# Patient Record
Sex: Male | Born: 1975 | Race: White | Hispanic: No | Marital: Married | Smoking: Current every day smoker
Health system: Southern US, Community
[De-identification: ages and names within clinical notes are randomized; demographics above are authoritative.]

## PROBLEM LIST (undated history)

## (undated) DIAGNOSIS — Z98811 Dental restoration status: Secondary | ICD-10-CM

## (undated) DIAGNOSIS — E781 Pure hyperglyceridemia: Secondary | ICD-10-CM

## (undated) DIAGNOSIS — R7303 Prediabetes: Secondary | ICD-10-CM

## (undated) DIAGNOSIS — S80811A Abrasion, right lower leg, initial encounter: Secondary | ICD-10-CM

## (undated) DIAGNOSIS — M67432 Ganglion, left wrist: Secondary | ICD-10-CM

## (undated) HISTORY — PX: KNEE ARTHROSCOPY: SHX127

---

## 2001-11-08 ENCOUNTER — Encounter: Payer: Self-pay | Admitting: *Deleted

## 2001-11-08 ENCOUNTER — Encounter: Admission: RE | Admit: 2001-11-08 | Discharge: 2001-11-08 | Payer: Self-pay | Admitting: *Deleted

## 2007-10-09 ENCOUNTER — Emergency Department (HOSPITAL_COMMUNITY): Admission: EM | Admit: 2007-10-09 | Discharge: 2007-10-09 | Payer: Self-pay | Admitting: Family Medicine

## 2007-12-03 ENCOUNTER — Emergency Department (HOSPITAL_COMMUNITY): Admission: EM | Admit: 2007-12-03 | Discharge: 2007-12-03 | Payer: Self-pay | Admitting: Emergency Medicine

## 2008-11-03 ENCOUNTER — Emergency Department (HOSPITAL_COMMUNITY): Admission: EM | Admit: 2008-11-03 | Discharge: 2008-11-03 | Payer: Self-pay | Admitting: Family Medicine

## 2009-01-05 ENCOUNTER — Emergency Department (HOSPITAL_COMMUNITY): Admission: EM | Admit: 2009-01-05 | Discharge: 2009-01-05 | Payer: Self-pay | Admitting: Family Medicine

## 2009-07-20 ENCOUNTER — Ambulatory Visit (HOSPITAL_COMMUNITY): Admission: RE | Admit: 2009-07-20 | Discharge: 2009-07-20 | Payer: Self-pay | Admitting: Sports Medicine

## 2010-01-03 ENCOUNTER — Emergency Department (HOSPITAL_COMMUNITY): Admission: EM | Admit: 2010-01-03 | Discharge: 2010-01-03 | Payer: Self-pay | Admitting: Family Medicine

## 2010-04-28 ENCOUNTER — Inpatient Hospital Stay (INDEPENDENT_AMBULATORY_CARE_PROVIDER_SITE_OTHER)
Admission: RE | Admit: 2010-04-28 | Discharge: 2010-04-28 | Disposition: A | Discharge status: Home / Self Care | Payer: 59 | Source: Ambulatory Visit | Attending: Emergency Medicine | Admitting: Emergency Medicine

## 2010-04-28 DIAGNOSIS — K5289 Other specified noninfective gastroenteritis and colitis: Secondary | ICD-10-CM

## 2011-04-22 ENCOUNTER — Encounter (HOSPITAL_COMMUNITY): Payer: Self-pay

## 2011-04-22 ENCOUNTER — Emergency Department (INDEPENDENT_AMBULATORY_CARE_PROVIDER_SITE_OTHER)
Admission: EM | Admit: 2011-04-22 | Discharge: 2011-04-22 | Disposition: A | Discharge status: Home / Self Care | Payer: 59 | Source: Home / Self Care | Attending: Family Medicine | Admitting: Family Medicine

## 2011-04-22 DIAGNOSIS — K047 Periapical abscess without sinus: Secondary | ICD-10-CM

## 2011-04-22 DIAGNOSIS — K089 Disorder of teeth and supporting structures, unspecified: Secondary | ICD-10-CM

## 2011-04-22 DIAGNOSIS — K0889 Other specified disorders of teeth and supporting structures: Secondary | ICD-10-CM

## 2011-04-22 MED ORDER — AMOXICILLIN 500 MG PO CAPS: 500.0000 mg | ORAL_CAPSULE | Freq: Three times a day (TID) | ORAL | Status: AC

## 2011-04-22 MED ORDER — HYDROCODONE-ACETAMINOPHEN 5-325 MG PO TABS: ORAL_TABLET | ORAL | Status: AC

## 2011-04-22 NOTE — ED Notes (Signed)
C/o facial swelling and pain since yesterday; minimal relief w OTC meds

## 2011-04-22 NOTE — ED Provider Notes (Signed)
History     CSN: [id number]  Arrival date & time 04/22/11  0932   First MD Initiated Contact with Patient 04/22/11 [phone]      Chief Complaint  Patient presents with  . Facial Swelling    (Consider location/radiation/quality/duration/timing/severity/associated sxs/prior treatment) HPI Comments: Jonathan Choi presents for evaluation of pain and swelling in his upper RIGHT gumline over the last 2 days. He denies any tooth pain or drainage. He has not had any fever.   Patient is a 36 y.o. male presenting with tooth pain. The history is provided by the patient.  Dental PainThe primary symptoms include mouth pain. Primary symptoms do not include dental injury, oral bleeding or oral lesions. The symptoms began yesterday. The symptoms are unchanged. The symptoms occur constantly.  Additional symptoms include: gum swelling, gum tenderness and facial swelling. Additional symptoms do not include: purulent gums, trismus, trouble swallowing and taste disturbance.    History reviewed. No pertinent past medical history.  History reviewed. No pertinent past surgical history.  History reviewed. No pertinent family history.  History  Substance Use Topics  . Smoking status: Current Everyday Smoker  . Smokeless tobacco: Not on file  . Alcohol Use: No      Review of Systems  Constitutional: Negative.   HENT: Positive for facial swelling and dental problem. Negative for trouble swallowing.   Eyes: Negative.   Respiratory: Negative.   Cardiovascular: Negative.   Gastrointestinal: Negative.   Genitourinary: Negative.   Musculoskeletal: Negative.   Skin: Negative.   Neurological: Negative.     Allergies  Review of patient's allergies indicates no known allergies.  Home Medications   Current Outpatient Rx  Name Route Sig Dispense Refill  . AMOXICILLIN 500 MG PO CAPS Oral Take 1 capsule (500 mg total) by mouth 3 (three) times daily. 30 capsule 0  . HYDROCODONE-ACETAMINOPHEN 5-325 MG PO TABS  Take  one to two tablets every 4 to 6 hours as needed for pain 20 tablet 0    BP 135/85  Pulse 70  Temp(Src) 98.1 F (36.7 C) (Oral)  Resp 20  SpO2 98%  Physical Exam  Nursing note and vitals reviewed. Constitutional: He is oriented to person, place, and time. He appears well-developed and well-nourished.  HENT:  Head: Normocephalic and atraumatic.  Mouth/Throat: Uvula is midline, oropharynx is clear and moist and mucous membranes are normal. Dental abscesses and dental caries present.       Tenderness along upper RIGHT gumline with swelling, no fluctuance noted, no drainage  Eyes: EOM are normal.  Neck: Normal range of motion.  Pulmonary/Chest: Effort normal.  Musculoskeletal: Normal range of motion.  Neurological: He is alert and oriented to person, place, and time.  Skin: Skin is warm and dry.  Psychiatric: His behavior is normal.    ED Course  Procedures (including critical care time)  Labs Reviewed - No data to display No results found.   1. Pain, dental   2. Periapical abscess       MDM  Amoxicillin (allergic to clindamycin), hydrocodone PRN, referred to [facility]        Richardo Priest, MD 04/22/11 1030

## 2011-12-15 DIAGNOSIS — Z23 Encounter for immunization: Secondary | ICD-10-CM

## 2012-08-15 ENCOUNTER — Emergency Department (HOSPITAL_COMMUNITY)
Admission: EM | Admit: 2012-08-15 | Discharge: 2012-08-15 | Disposition: A | Discharge status: Home / Self Care | Payer: 59 | Attending: Emergency Medicine | Admitting: Emergency Medicine

## 2012-08-15 ENCOUNTER — Encounter (HOSPITAL_COMMUNITY): Payer: Self-pay | Admitting: Emergency Medicine

## 2012-08-15 DIAGNOSIS — R51 Headache: Secondary | ICD-10-CM | POA: Insufficient documentation

## 2012-08-15 DIAGNOSIS — Y9389 Activity, other specified: Secondary | ICD-10-CM | POA: Insufficient documentation

## 2012-08-15 DIAGNOSIS — W010XXA Fall on same level from slipping, tripping and stumbling without subsequent striking against object, initial encounter: Secondary | ICD-10-CM | POA: Insufficient documentation

## 2012-08-15 DIAGNOSIS — R42 Dizziness and giddiness: Secondary | ICD-10-CM | POA: Insufficient documentation

## 2012-08-15 DIAGNOSIS — S80811A Abrasion, right lower leg, initial encounter: Secondary | ICD-10-CM

## 2012-08-15 DIAGNOSIS — Z23 Encounter for immunization: Secondary | ICD-10-CM | POA: Insufficient documentation

## 2012-08-15 DIAGNOSIS — IMO0002 Reserved for concepts with insufficient information to code with codable children: Secondary | ICD-10-CM | POA: Insufficient documentation

## 2012-08-15 DIAGNOSIS — F17200 Nicotine dependence, unspecified, uncomplicated: Secondary | ICD-10-CM | POA: Insufficient documentation

## 2012-08-15 DIAGNOSIS — Y929 Unspecified place or not applicable: Secondary | ICD-10-CM | POA: Insufficient documentation

## 2012-08-15 DIAGNOSIS — W1809XA Striking against other object with subsequent fall, initial encounter: Secondary | ICD-10-CM | POA: Insufficient documentation

## 2012-08-15 MED ORDER — TETANUS-DIPHTH-ACELL PERTUSSIS 5-2.5-18.5 LF-MCG/0.5 IM SUSP
0.5000 mL | Freq: Once | INTRAMUSCULAR | Status: AC
  Administered 2012-08-15: 0.5 mL via INTRAMUSCULAR
  Filled 2012-08-15: qty 0.5

## 2012-08-15 NOTE — ED Notes (Signed)
Patient with wound on shin of right leg.  Patient states that he hit his shin on the back of his pickup bed, had some grease on the bed.  Area of the wound has pus in it.  Skin around the wound is red, tender.

## 2012-08-15 NOTE — ED Notes (Signed)
PT. REPORTS INFECTED ABRASION AT RIGHT SHIN WITH DRAINAGE , PT . STATED HE TRIPPED AND FELL LAST Friday AND SUSTAINED ABRASION AT RIGHT SHIN , NO LOC /AMBULATORY.

## 2012-08-15 NOTE — ED Notes (Signed)
PT comfortable with d/c and f/u instructions. No prescriptions. 

## 2012-08-15 NOTE — ED Provider Notes (Signed)
History  This chart was scribed for Jonathan Baker, MD by Ardelia Mems, ED Scribe. This patient was seen in room TR06C/TR06C and the patient's care was started at 10:28 PM.   CSN: [id number]  Arrival date & time 08/15/12  2046      Chief Complaint  Patient presents with  . Abrasion     The history is provided by the patient. No language interpreter was used.    HPI Comments: Jonathan Choi is a 37 y.o. male who presents to the Emergency Department complaining of an infected abrasion on his right shin. Pt states that there is associated drainage of the abrasion. Pt states that he has had associated headaches and dizziness for the past 2 days. Pt states that he that he hit his shin on the trailer hitch of a truck approximately 10 days ago, causing the abrasion. He states that touching the affected area worsens the pain. Pt is ambulatory. Pt states that he does not have any chronic medical conditions. Pt is unsure if Tetanus shots are UTD. Pt denies LOC, confusion or any other symptoms. Pt denies alcohol use and is a current every day smoker.   History reviewed. No pertinent past medical history.  History reviewed. No pertinent past surgical history.  No family history on file.  History  Substance Use Topics  . Smoking status: Current Every Day Smoker  . Smokeless tobacco: Not on file  . Alcohol Use: No      Review of Systems  Neurological: Positive for dizziness and headaches. Negative for syncope.    Allergies  Review of patient's allergies indicates no known allergies.  Home Medications  No current outpatient prescriptions on file.  Triage Vitals: BP 141/92  Pulse 98  Temp(Src) 98.7 F (37.1 C) (Oral)  Resp 18  SpO2 98%  Physical Exam  Nursing note and vitals reviewed. Constitutional: He is oriented to person, place, and time. He appears well-developed and well-nourished.  HENT:  Head: Normocephalic and atraumatic.  Eyes: EOM are normal. Pupils are equal,  round, and reactive to light.  Neck: Normal range of motion. No tracheal deviation present.  Pulmonary/Chest: Effort normal. No respiratory distress.  Abdominal: Soft. There is no tenderness.  Musculoskeletal: Normal range of motion.  Neurological: He is alert and oriented to person, place, and time.  Skin: Skin is warm. No rash noted.  Healing abrasion to anterior right shin with 1-2 cm surrounding vasculitic appearing red rash. Non-blanching. No warmth. No calf tenderness. 1+ pitting edema right LE.  Psychiatric: He has a normal mood and affect.    ED Course  Procedures (including critical care time)  DIAGNOSTIC STUDIES: Oxygen Saturation is 98% on RA, normal by my interpretation.    COORDINATION OF CARE: 11:00 PM- Pt advised of plan for treatment and pt agrees.  Medications  TDaP (BOOSTRIX) injection 0.5 mL (not administered)      Labs Reviewed - No data to display No results found.   No diagnosis found. 1. Abrasion, right leg   MDM  No evidence to support cellulitic changes with redness atypical for infection surrounding healing wound. Dr. Arnoldo Morale in to see patient and agrees. Tetanus updated. Encouraged return if symptoms change or worsen.         I personally performed the services described in this documentation, which was scribed in my presence. The recorded information has been reviewed and is accurate.    Arnoldo Hooker, PA-C 08/15/12 2334

## 2012-08-18 NOTE — ED Provider Notes (Signed)
Medical screening examination/treatment/procedure(s) were performed by non-physician practitioner and as supervising physician I was immediately available for consultation/collaboration.   Julie Manly, MD 08/18/12 0018 

## 2012-10-30 ENCOUNTER — Emergency Department (INDEPENDENT_AMBULATORY_CARE_PROVIDER_SITE_OTHER)
Admission: EM | Admit: 2012-10-30 | Discharge: 2012-10-30 | Disposition: A | Discharge status: Home / Self Care | Payer: 59 | Source: Home / Self Care | Attending: Family Medicine | Admitting: Family Medicine

## 2012-10-30 ENCOUNTER — Encounter (HOSPITAL_COMMUNITY): Payer: Self-pay | Admitting: Emergency Medicine

## 2012-10-30 DIAGNOSIS — S36899A Unspecified injury of other intra-abdominal organs, initial encounter: Secondary | ICD-10-CM

## 2012-10-30 DIAGNOSIS — S31831A Laceration without foreign body of anus, initial encounter: Secondary | ICD-10-CM

## 2012-10-30 MED ORDER — HYDROCORTISONE ACETATE 25 MG RE SUPP: 25.0000 mg | Freq: Two times a day (BID) | RECTAL | Status: DC

## 2012-10-30 NOTE — ED Notes (Signed)
C/o rectal bleeding which started yesterday evening.  Patient states he is not constipated.  No medication tried.

## 2012-10-30 NOTE — ED Provider Notes (Signed)
  CSN: [id number]     Arrival date & time 10/30/12  1220 History     None    Chief Complaint  Patient presents with  . Rectal Bleeding   (Consider location/radiation/quality/duration/timing/severity/associated sxs/prior Treatment) Patient is a 37 y.o. male presenting with hematochezia. The history is provided by the patient.  Rectal Bleeding Quality:  Bright red Amount:  Scant Duration:  1 day Timing:  Sporadic Chronicity:  New Context: spontaneously   Context: not anal fissures, not anal penetration, not constipation, not diarrhea, not hemorrhoids and not rectal pain   Similar prior episodes: no   Relieved by:  None tried Worsened by:  Nothing tried Associated symptoms: no abdominal pain and no vomiting     History reviewed. No pertinent past medical history. History reviewed. No pertinent past surgical history. History reviewed. No pertinent family history. History  Substance Use Topics  . Smoking status: Current Every Day Smoker  . Smokeless tobacco: Not on file  . Alcohol Use: No    Review of Systems  Constitutional: Negative.   Gastrointestinal: Positive for hematochezia and anal bleeding. Negative for nausea, vomiting, abdominal pain, diarrhea, constipation, blood in stool and rectal pain.    Allergies  Review of patient's allergies indicates no known allergies.  Home Medications   Current Outpatient Rx  Name  Route  Sig  Dispense  Refill  . hydrocortisone (ANUSOL-HC) 25 MG suppository   Rectal   Place 1 suppository (25 mg total) rectally 2 (two) times daily.   12 suppository   0    BP 138/83  Pulse 72  Temp(Src) 99.1 F (37.3 C) (Oral)  Resp 20  SpO2 97% Physical Exam  Nursing note and vitals reviewed. Constitutional: He is oriented to person, place, and time. He appears well-developed and well-nourished.  Abdominal: Soft. Bowel sounds are normal. He exhibits no distension and no mass. There is no tenderness. There is no rebound and no guarding.   Genitourinary:  Superficial anal tear evident at 1oclock location, blood evident , nontender. No blood on dre, no mass,   Neurological: He is alert and oriented to person, place, and time.  Skin: Skin is warm and dry.    ED Course   Procedures (including critical care time)  Labs Reviewed - No data to display No results found. 1. Anal sphincter tear, initial encounter     MDM    Linna Hoff, MD 10/30/12 1410

## 2013-01-14 ENCOUNTER — Ambulatory Visit: Payer: 59

## 2013-04-22 ENCOUNTER — Emergency Department (HOSPITAL_COMMUNITY)
Admission: EM | Admit: 2013-04-22 | Discharge: 2013-04-22 | Disposition: A | Discharge status: Home / Self Care | Payer: 59 | Source: Home / Self Care | Attending: Emergency Medicine | Admitting: Emergency Medicine

## 2013-04-22 ENCOUNTER — Encounter (HOSPITAL_COMMUNITY): Payer: Self-pay | Admitting: Emergency Medicine

## 2013-04-22 DIAGNOSIS — L299 Pruritus, unspecified: Secondary | ICD-10-CM

## 2013-04-22 LAB — HEPATIC FUNCTION PANEL
ALT: 35 U/L (ref 0–53)
AST: 24 U/L (ref 0–37)
Albumin: 3.9 g/dL (ref 3.5–5.2)
Alkaline Phosphatase: 127 U/L — ABNORMAL HIGH (ref 39–117)
Bilirubin, Direct: <0.2 mg/dL (ref 0.0–0.3)
TOTAL PROTEIN: 7.6 g/dL (ref 6.0–8.3)
Total Bilirubin: <0.2 mg/dL — ABNORMAL LOW (ref 0.3–1.2)

## 2013-04-22 LAB — POCT I-STAT, CHEM 8
BUN: 12 mg/dL (ref 6–23)
CALCIUM ION: 1.19 mmol/L (ref 1.12–1.23)
CHLORIDE: 106 meq/L (ref 96–112)
Creatinine, Ser: 1.1 mg/dL (ref 0.50–1.35)
Glucose, Bld: 97 mg/dL (ref 70–99)
HCT: 46 % (ref 39.0–52.0)
Hemoglobin: 15.6 g/dL (ref 13.0–17.0)
Potassium: 3.9 mEq/L (ref 3.7–5.3)
Sodium: 140 mEq/L (ref 137–147)
TCO2: 25 mmol/L (ref 0–100)

## 2013-04-22 MED ORDER — METHYLPREDNISOLONE ACETATE 80 MG/ML IJ SUSP
INTRAMUSCULAR | Status: AC
  Filled 2013-04-22: qty 1

## 2013-04-22 MED ORDER — RANITIDINE HCL 150 MG PO TABS: 150.0000 mg | ORAL_TABLET | Freq: Two times a day (BID) | ORAL | Status: DC

## 2013-04-22 MED ORDER — PREDNISONE 20 MG PO TABS: ORAL_TABLET | ORAL | Status: DC

## 2013-04-22 MED ORDER — METHYLPREDNISOLONE ACETATE 80 MG/ML IJ SUSP
80.0000 mg | Freq: Once | INTRAMUSCULAR | Status: AC
  Administered 2013-04-22: 80 mg via INTRAMUSCULAR

## 2013-04-22 NOTE — Discharge Instructions (Signed)
Take Zyrtec 10 mg daily.    Pruritus  Pruritis is an itch. There are many different problems that can cause an itch. Dry skin is one of the most common causes of itching. Most cases of itching do not require medical attention.  HOME CARE INSTRUCTIONS  Make sure your skin is moistened on a regular basis. A moisturizer that contains petroleum jelly is best for keeping moisture in your skin. If you develop a rash, you may try the following for relief:   Use corticosteroid cream.  Apply cool compresses to the affected areas.  Bathe with Epsom salts or baking soda in the bathwater.  Soak in colloidal oatmeal baths. These are available at your pharmacy.  Apply baking soda paste to the rash. Stir water into baking soda until it reaches a paste-like consistency.  Use an anti-itch lotion.  Take over-the-counter diphenhydramine medicine by mouth as the instructions direct.  Avoid scratching. Scratching may cause the rash to become infected. If itching is very bad, your caregiver may suggest prescription lotions or creams to lessen your symptoms.  Avoid hot showers, which can make itching worse. A cold shower may help with itching as long as you use a moisturizer after the shower. SEEK MEDICAL CARE IF: The itching does not go away after several days. Document Released: 11/17/2010 Document Revised: 05/30/2011 Document Reviewed: 11/17/2010 [facility] Patient Information 2014 [employer]ExitCare, [place].

## 2013-04-22 NOTE — ED Provider Notes (Signed)
Chief Complaint    Chief Complaint  Patient presents with  . Pruritis    History of Present Illness      Anderson MaltaMark C Allsup is a 38 year old male who has had a two-week history of migratory itching on his posterior neck, chest, abdomen, upper arms, and thighs. He cannot see any rash or breaking out in these areas. The itching seems to migrate from area to area. It's worse after a hot shower. He denies any fever, chills, sore throat, or URI symptoms. He has not been exposed to any allergens or antigens. No new foods or medications. No rash, no exposure to scabies.  Review of Systems   Other than as noted above, the patient denies any of the following symptoms: Systemic:  No fever, chills, or myalgias. ENT:  No nasal congestion, rhinorrhea, sore throat, swelling of lips, tongue or throat. Resp:  No cough, wheezing, or shortness of breath.  PMFSH    Past medical history, family history, social history, meds, and allergies were reviewed.   Physical Exam     Vital signs:  BP 135/85  Pulse 80  Temp(Src) 97.8 F (36.6 C) (Oral)  Resp 16  SpO2 95% Gen:  Alert, oriented, in no distress. ENT:  Pharynx clear, no intraoral lesions, moist mucous membranes. Lungs:  Clear to auscultation. Skin:  Skin is completely clear. There is no rash or erythema, no jaundice or scleral icterus.   Labs   Results for orders placed during the hospital encounter of 04/22/13  HEPATIC FUNCTION PANEL      Result Value Range   Total Protein 7.6  6.0 - 8.3 g/dL   Albumin 3.9  3.5 - 5.2 g/dL   AST 24  0 - 37 U/L   ALT 35  0 - 53 U/L   Alkaline Phosphatase 127 (*) 39 - 117 U/L   Total Bilirubin <0.2 (*) 0.3 - 1.2 mg/dL   Bilirubin, Direct <1.6<0.2  0.0 - 0.3 mg/dL   Indirect Bilirubin NOT CALCULATED  0.3 - 0.9 mg/dL  POCT I-STAT, CHEM 8      Result Value Range   Sodium 140  137 - 147 mEq/L   Potassium 3.9  3.7 - 5.3 mEq/L   Chloride 106  96 - 112 mEq/L   BUN 12  6 - 23 mg/dL   Creatinine, Ser 1.091.10  0.50 -  1.35 mg/dL   Glucose, Bld 97  70 - 99 mg/dL   Calcium, Ion 6.041.19  5.401.12 - 1.23 mmol/L   TCO2 25  0 - 100 mmol/L   Hemoglobin 15.6  13.0 - 17.0 g/dL   HCT 98.146.0  19.139.0 - 47.852.0 %   Course in Urgent Care Center     Given Depo-Medrol 80 mg IM.  Assessment    The encounter diagnosis was Pruritus.  Cause of the pruritus is most likely due to allergy. I don't see any evidence of scabies. His i-STAT 8 was normal. Alkaline phosphatase was minimally elevated, probably not significant, but does need a followup. Will treat as for allergy, and have him followup with Dr. Para SkeansFred Lupton if no better in 2 weeks.  Plan     1.  Meds:  The following meds were prescribed:   New Prescriptions   PREDNISONE (DELTASONE) 20 MG TABLET    3 daily for 5 days, 2 daily for 5 days, 1 daily for 5 days   RANITIDINE (ZANTAC) 150 MG TABLET    Take 1 tablet (150 mg total) by mouth 2 (two)  times daily.   Suggest he take over-the-counter Zyrtec 10 mg daily.  2.  Patient Education/Counseling:  The patient was given appropriate handouts, self care instructions, and instructed in symptomatic relief.    3.  Follow up:  The patient was told to follow up here if no better in 3 to 4 days, or sooner if becoming worse in any way, and given some red flag symptoms such as worsening rash, fever, or difficulty breathing which would prompt immediate return.  Follow up with Dr. Para Skeans if no better in 2 weeks.      Reuben Likes, MD 04/22/13 [phone]

## 2013-04-24 NOTE — ED Notes (Signed)
Hepatic Function panel: Alkaline phos. 127 H, Total bili <0.2 L, rest within normal limits.  2/3 Message sent to Dr. Lorenz CoasterKeller.  2/4 He said he notified the pt. and told him to f/u with his PCP. Vassie MoselleYork, Raeven Pint M 04/24/2013

## 2013-07-23 ENCOUNTER — Emergency Department (INDEPENDENT_AMBULATORY_CARE_PROVIDER_SITE_OTHER): Payer: 59

## 2013-07-23 ENCOUNTER — Emergency Department (HOSPITAL_COMMUNITY)
Admission: EM | Admit: 2013-07-23 | Discharge: 2013-07-23 | Disposition: A | Discharge status: Home / Self Care | Payer: 59 | Source: Home / Self Care | Attending: Emergency Medicine | Admitting: Emergency Medicine

## 2013-07-23 ENCOUNTER — Encounter (HOSPITAL_COMMUNITY): Payer: Self-pay | Admitting: Emergency Medicine

## 2013-07-23 DIAGNOSIS — M84375A Stress fracture, left foot, initial encounter for fracture: Secondary | ICD-10-CM

## 2013-07-23 DIAGNOSIS — M8430XA Stress fracture, unspecified site, initial encounter for fracture: Secondary | ICD-10-CM

## 2013-07-23 MED ORDER — HYDROCODONE-ACETAMINOPHEN 5-325 MG PO TABS: ORAL_TABLET | ORAL | Status: DC

## 2013-07-23 NOTE — ED Provider Notes (Signed)
  Chief Complaint   Chief Complaint  Patient presents with  . Foot Pain    History of Present Illness   Jonathan Choi is a 38 year old male who has had a three-day history of pain in the left foot overlying the third metatarsal. There is slight swelling. There is no erythema or heat. He denies any injury to the foot. It hurts with weightbearing it is better if he gets off his feet. He denies any numbness or tingling.  Review of Systems   Other than as noted above, the patient denies any of the following symptoms: Systemic:  No fevers or chills. Musculoskeletal:  No joint pain or arthritis.  Neurological:  No muscular weakness, paresthesias.   PMFSH   Past medical history, family history, social history, meds, and allergies were reviewed.     Physical  Examination     Vital signs:  BP 127/86  Pulse 75  Temp(Src) 98.1 F (36.7 C) (Oral)  Resp 16  SpO2 97% Gen:  Alert and oriented times 3.  In no distress. Musculoskeletal:  Exam of the foot reveals localized tenderness to palpation over the third metatarsal. No swelling or erythema.  Otherwise, all joints had a full a ROM with no swelling, bruising or deformity.  No edema, pulses full. Extremities were warm and pink.  Capillary refill was brisk.  Skin:  Clear, warm and dry.  No rash. Neuro:  Alert and oriented times 3.  Muscle strength was normal.  Sensation was intact to light touch.    Radiology   Dg Foot Complete Left  07/23/2013   CLINICAL DATA:  Left foot pain for 3 days.  No known injury  EXAM: LEFT FOOT - COMPLETE 3+ VIEW  COMPARISON:  None.  FINDINGS: Three views of left foot submitted. No acute fracture or subluxation. No radiopaque foreign body. No periosteal reaction or bony erosion.  IMPRESSION: Negative.   Electronically Signed   By: Natasha MeadLiviu  Pop M.D.   On: 07/23/2013 17:11   I reviewed the images independently and personally and concur with the radiologist's findings.  Course in Urgent Care Center   Given a Cam  Walker boot and crutches.  Assessment   The encounter diagnosis was Stress fracture of left foot.  No evidence of gout or acute fracture.  Plan    1.  Meds:  The following meds were prescribed:   Discharge Medication List as of 07/23/2013  5:25 PM    START taking these medications   Details  HYDROcodone-acetaminophen (NORCO/VICODIN) 5-325 MG per tablet 1 to 2 tabs every 4 to 6 hours as needed for pain., Print        2.  Patient Education/Counseling:  The patient was given appropriate handouts, self care instructions, and instructed in symptomatic relief including rest and activity, elevation, application of ice and compression.    3.  Follow up:  The patient was told to follow up here if no better in 3 to 4 days, or sooner if becoming worse in any way, and given some red flag symptoms such as worsening pain or neurological symptoms which would prompt immediate return.  Follow up with Dr. Renaye Rakersim Murphy within the next week.      Reuben Likesavid C  Rodriquez, MD 07/23/13 [phone]

## 2013-07-23 NOTE — Discharge Instructions (Signed)
Stress Fracture When too much stress is put on the foot, as may occur in running and jumping sports, the lengthy shafts of the bones of the forefoot become susceptible to breaking due to repetitive stress (stress fracture) because of thinness of these bone. A stress fracture is more common if osteoporosis is present or if inadequate athletic footwear is used. Shoes should be used which adequately support the sole of the foot to absorb the shocks of the activity participated in. Stress fractures are very common in competitive male runners who develop these small cracks on the surface of the bones in their legs and feet. The women most likely to suffer these injuries are those who restrict food intake and those who have irregular periods. Stress fractures usually start out as a minor discomfort in the foot or leg. The completion of fracture due to repetitive loading often occurs near the end of a long run. The pain may dissipate with rest. With the next exercise session, the pain may return earlier in the run. If an athlete notices that it hurts to touch just one spot on a bone and then stops running for a week, he or she may be tempted to return to running too soon. Often the pain is ignored in order to continue with high impact exercise. A stress fracture then develops. The athlete now has to avoid the hard pounding of running, but can ride a bike or swim for exercise once the pain has resolved with normal weight bearing until the fracture heals in 6 12 weeks. The most common sites for stress fractures are the bones in the front of the feet (metatarsals) and the long bone of the lower leg (tibia), but running can cause stress fractures anywhere in the lower extremities or pelvis. DIAGNOSIS  Usually the diagnosis is made by reviewing the patient's history. The bone involved progressively becomes more painful with activities. X-rays may show no break within the first 2 3 weeks that pain begins. A later X-ray may  show signs that the bone is healing. Having a bone scan or MRI usually makes an earlier diagnosis possible. HOME CARE INSTRUCTIONS  Treatment may include a cast or walking shoe.  High impact activities should be stopped until advised by your caregiver.  Wear shoes with adequate shock absorbing abilities and good support of the sole of the foot. This is especially important in the arch of the foot.  Alternative exercise may be undertaken while waiting for healing. This may include bicycling and swimming. If you do not have a cast or splint:  You may walk on your injured foot as tolerated or advised.  Do not put any weight on your injured foot until instructed. Slowly increase the amount of time you walk on the foot as the pain allows or as advised.  Use crutches until you can bear weight without pain. A gradual increase in weight bearing may help.  Apply ice to the injured area for the first 2 days after you have been treated or as directed by your caregiver.  Put ice in a plastic bag.  Place a towel between your skin and the bag.  Leave the ice on for 15 20 minutes at a time, every hour while you are awake.  Only take over-the-counter or prescription medicines for pain or discomfort as directed by your caregiver.  If your caregiver has given you a follow-up appointment, it is very important to keep that appointment. Not keeping the appointment could result in a   chronic or permanent injury, pain, and disability. SEEK IMMEDIATE MEDICAL CARE IF:   Pain is becoming worse rather than better.  Pain is uncontrolled with medicine.  You have increased swelling or redness in the foot.  The feeling in the foot or leg is diminished. MAKE SURE YOU:   Understand these instructions.  Will watch your condition.  Will get help right away if you are not doing well or get worse. Document Released: 05/28/2002 Document Revised: 07/02/2012 Document Reviewed: 10/22/2007 Exit[employer] Patient  Information 2014 [employer].  

## 2013-07-23 NOTE — ED Notes (Signed)
Pt c/o pain on top of left foot onset 3 days Sx increase when bearing wt Denies inj/trauma Alert w/no signs of acute distress; ambulated well to exam room w/NAD

## 2014-04-14 ENCOUNTER — Encounter: Payer: Self-pay | Admitting: Family Medicine

## 2014-04-14 ENCOUNTER — Ambulatory Visit (INDEPENDENT_AMBULATORY_CARE_PROVIDER_SITE_OTHER): Payer: 59 | Admitting: Family Medicine

## 2014-04-14 VITALS — BP 132/84 | HR 81 | Temp 98.0°F | Ht 71.0 in | Wt 213.4 lb

## 2014-04-14 DIAGNOSIS — E663 Overweight: Secondary | ICD-10-CM

## 2014-04-14 DIAGNOSIS — Z720 Tobacco use: Secondary | ICD-10-CM

## 2014-04-14 DIAGNOSIS — IMO0001 Reserved for inherently not codable concepts without codable children: Secondary | ICD-10-CM | POA: Insufficient documentation

## 2014-04-14 DIAGNOSIS — K6289 Other specified diseases of anus and rectum: Secondary | ICD-10-CM

## 2014-04-14 DIAGNOSIS — E669 Obesity, unspecified: Secondary | ICD-10-CM | POA: Insufficient documentation

## 2014-04-14 DIAGNOSIS — F17200 Nicotine dependence, unspecified, uncomplicated: Secondary | ICD-10-CM

## 2014-04-14 MED ORDER — VARENICLINE TARTRATE 0.5 MG X 11 & 1 MG X 42 PO MISC: ORAL | Status: DC

## 2014-04-14 MED ORDER — VARENICLINE TARTRATE 1 MG PO TABS: 1.0000 mg | ORAL_TABLET | Freq: Two times a day (BID) | ORAL | Status: DC

## 2014-04-14 NOTE — Assessment & Plan Note (Signed)
Pt has skin issues in area between anus and testicles with frequent skin infections/ boils  Also irritation upon wiping after bms  On exam - some mild scarring/ also non thrombosed ext hemorroids (no fissure or fistula noted)  Will ref to derm for recurrent problem with boils

## 2014-04-14 NOTE — Patient Instructions (Addendum)
Try chantix to quit smoking (start with the starter pack and if that goes well than continue 1 mg twice daily)  If side effects / or if it causes depression please stop it and let me know  For weight loss I recommend more exercise and less calories (lower calorie foods and smaller portions) There is a good app to look at called "myfitnesspal" - check it out  I would like to refer you to dermatology for the rectal issue you are having -please stop at check out  Drink more water and keep eating fiber  Avoid sugar drinks as much as you can    Follow up in 6 months for a physical with labs prior
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
## 2014-04-14 NOTE — Assessment & Plan Note (Signed)
Disc strategies for wt loss  Discussed how this problem influences overall health and the risks it imposes  Reviewed plan for weight loss with lower calorie diet (via better food choices and also portion control or program like weight watchers) and exercise building up to or more than 30 minutes 5 days per week including some aerobic activity

## 2014-04-14 NOTE — Assessment & Plan Note (Signed)
Disc in detail risks of smoking and possible outcomes including copd, vascular/ heart disease, cancer , respiratory and sinus infections  Pt voices understanding  Pt is interested in quitting in the future-has failed nicotine replacement  Given px for chantix -disc side eff poss in detail and given handout   ( if he tries this and develops mood change-will stop it and update) Given px for starter pack and then mt dose to fill when he is ready)

## 2014-04-15 ENCOUNTER — Telehealth: Payer: Self-pay | Admitting: Family Medicine

## 2014-04-15 NOTE — Telephone Encounter (Signed)
emmi emailed °

## 2014-04-17 ENCOUNTER — Telehealth: Payer: Self-pay | Admitting: *Deleted

## 2014-04-17 NOTE — Telephone Encounter (Signed)
Prior auth forms placed in Shapale's inbox to await cb from pt.

## 2014-04-17 NOTE — Telephone Encounter (Signed)
Received prior auth request from [facility] for Chantix. I called pt and left message on machine to return call to office. When he calls we need to find out which nicotine replacements he has tried and failed, and if he is currently using any.

## 2014-04-18 NOTE — Telephone Encounter (Signed)
Patient called back and stated he tried a lot of other replacements from patches to gum.

## 2014-04-29 NOTE — Telephone Encounter (Signed)
I followed up on PA, and we still haven't received a response from insurance. I resubmitted PA through Cover my Meds.

## 2014-04-30 NOTE — Telephone Encounter (Signed)
Prior auth for chantix starting month pak has been denied.

## 2014-09-02 ENCOUNTER — Emergency Department
Admission: EM | Admit: 2014-09-02 | Discharge: 2014-09-02 | Disposition: A | Discharge status: Home / Self Care | Payer: 59 | Attending: Emergency Medicine | Admitting: Emergency Medicine

## 2014-09-02 ENCOUNTER — Encounter: Payer: Self-pay | Admitting: Emergency Medicine

## 2014-09-02 ENCOUNTER — Emergency Department: Payer: 59

## 2014-09-02 DIAGNOSIS — M79672 Pain in left foot: Secondary | ICD-10-CM | POA: Insufficient documentation

## 2014-09-02 DIAGNOSIS — Z720 Tobacco use: Secondary | ICD-10-CM | POA: Insufficient documentation

## 2014-09-02 DIAGNOSIS — Z79899 Other long term (current) drug therapy: Secondary | ICD-10-CM | POA: Diagnosis not present

## 2014-09-02 MED ORDER — METHYLPREDNISOLONE 4 MG PO TBPK: ORAL_TABLET | ORAL | Status: DC

## 2014-09-02 MED ORDER — TRAMADOL HCL 50 MG PO TABS: 50.0000 mg | ORAL_TABLET | Freq: Four times a day (QID) | ORAL | Status: DC | PRN

## 2014-09-02 NOTE — ED Provider Notes (Signed)
[facility] Emergency Department Provider Note  ____________________________________________  Time seen: Approximately 6:22 PM  I have reviewed the triage vital signs and the nursing notes.   HISTORY  Chief Complaint Foot Pain    HPI Jonathan Choi is a 39 y.o. male patient complaining of 1 week of left dorsal foot pain. Patient stated there is no provocative incident for his pain. Patient has noticed no deformity, edema or erythema. Patient is rating his pain as a 10 over 10 which is increases when he walks.   Past Medical History  Diagnosis Date  . Alcohol abuse   . Stress fracture 5/15    foot  . Smoker     Patient Active Problem List   Diagnosis Date Noted  . Smoking 04/14/2014  . Overweight 04/14/2014  . Anal inflammation 04/14/2014    History reviewed. No pertinent past surgical history.  Current Outpatient Rx  Name  Route  Sig  Dispense  Refill  . methylPREDNISolone (MEDROL DOSEPAK) 4 MG TBPK tablet      Take Tapered dose as directed   21 tablet   0   . traMADol (ULTRAM) 50 MG tablet   Oral   Take 1 tablet (50 mg total) by mouth every 6 (six) hours as needed for moderate pain.   12 tablet   0   . varenicline (CHANTIX PAK) 0.5 MG X 11 & 1 MG X 42 tablet      Take one 0.5 mg tablet by mouth once daily for 3 days, then increase to one 0.5 mg tablet twice daily for 4 days, then increase to one 1 mg tablet twice daily.   53 tablet   0   . varenicline (CHANTIX) 1 MG tablet   Oral   Take 1 tablet (1 mg total) by mouth 2 (two) times daily.   60 tablet   4     Allergies Review of patient's allergies indicates no known allergies.  Family History  Problem Relation Age of Onset  . Hypertension Mother   . Cancer Father   . Diabetes Father   . Stroke Maternal Grandfather     Social History History  Substance Use Topics  . Smoking status: Current Every Day Smoker  . Smokeless tobacco: Not on file  . Alcohol Use: No     Review of Systems Constitutional: No fever/chills Eyes: No visual changes. ENT: No sore throat. Cardiovascular: Denies chest pain. Respiratory: Denies shortness of breath. Gastrointestinal: No abdominal pain.  No nausea, no vomiting.  No diarrhea.  No constipation. Genitourinary: Negative for dysuria. Musculoskeletal: Left foot pain Skin: Negative for rash. Neurological: Negative for headaches, focal weakness or numbness. 10-point ROS otherwise negative.  ____________________________________________   PHYSICAL EXAM:  VITAL SIGNS: ED Triage Vitals  Enc Vitals Group     BP 09/02/14 1816 146/93 mmHg     Pulse Rate 09/02/14 1816 86     Resp 09/02/14 1816 20     Temp 09/02/14 1816 98.2 F (36.8 C)     Temp Source 09/02/14 1816 Oral     SpO2 09/02/14 1816 96 %     Weight 09/02/14 1816 220 lb (99.791 kg)     Height 09/02/14 1816 6' (1.829 m)     Head Cir --      Peak Flow --      Pain Score 09/02/14 1806 10     Pain Loc --      Pain Edu? --      Excl.  in GC? --     Constitutional: Alert and oriented. Well appearing and in no acute distress. Eyes: Conjunctivae are normal. PERRL. EOMI. Head: Atraumatic. Nose: No congestion/rhinnorhea. Mouth/Throat: Mucous membranes are moist.  Oropharynx non-erythematous. Neck: No stridor.  Hematological/Lymphatic/Immunilogical: No cervical lymphadenopathy. Cardiovascular: Normal rate, regular rhythm. Grossly normal heart sounds.  Good peripheral circulation. Respiratory: Normal respiratory effort.  No retractions. Lungs CTAB. Gastrointestinal: Soft and nontender. No distention. No abdominal bruits. No CVA tenderness. Musculoskeletal: No deformity of the left dorsal foot there is no edema or erythema. She is tender palpation dose aspect of the foot at the second and third metatarsal head. Patient is free nuchal range of motion of the phalanges.  Neurologic:  Normal speech and language. No gross focal neurologic deficits are appreciated.  Speech is normal. No gait instability. Skin:  Skin is warm, dry and intact. No rash noted. Psychiatric: Mood and affect are normal. Speech and behavior are normal.  ____________________________________________   LABS (all labs ordered are listed, but only abnormal results are displayed)  Labs Reviewed - No data to display ____________________________________________  EKG   ____________________________________________  RADIOLOGY  No acute findings on left foot x-ray ____________________________________________   PROCEDURES  Procedure(s) performed: None  Critical Care performed: No  ____________________________________________   INITIAL IMPRESSION / ASSESSMENT AND PLAN / ED COURSE  Pertinent labs & imaging results that were available during my care of the patient were reviewed by me and considered in my medical decision making (see chart for details).  Sprain left foot. ____________________________________________   FINAL CLINICAL IMPRESSION(S) / ED DIAGNOSES  Final diagnoses:  Foot pain, left      Joni Reining, PA-C 09/02/14 1905  Minna Antis, MD 09/02/14 [phone]

## 2014-09-02 NOTE — ED Notes (Signed)
Having pain to foot for about 1-2 weeks without injury.

## 2014-09-02 NOTE — ED Notes (Signed)
Pt reports that he has been having foot pain, for the last week. Denies any known injury.

## 2014-10-07 ENCOUNTER — Telehealth: Payer: Self-pay | Admitting: Family Medicine

## 2014-10-07 DIAGNOSIS — Z0000 Encounter for general adult medical examination without abnormal findings: Secondary | ICD-10-CM

## 2014-10-07 NOTE — Telephone Encounter (Signed)
-----   Message from Alvina Chouerri J Walsh sent at 10/01/2014 12:21 PM EDT ----- Regarding: Lab orders for Wednesday, 7.20.16 Patient is scheduled for CPX labs, please order future labs, Thanks , Camelia Engerri

## 2014-10-08 ENCOUNTER — Other Ambulatory Visit (INDEPENDENT_AMBULATORY_CARE_PROVIDER_SITE_OTHER): Payer: Commercial Managed Care - HMO

## 2014-10-08 DIAGNOSIS — Z0000 Encounter for general adult medical examination without abnormal findings: Secondary | ICD-10-CM

## 2014-10-08 DIAGNOSIS — R7989 Other specified abnormal findings of blood chemistry: Secondary | ICD-10-CM | POA: Diagnosis not present

## 2014-10-08 LAB — CBC WITH DIFFERENTIAL/PLATELET
BASOS PCT: 0.5 % (ref 0.0–3.0)
Basophils Absolute: 0.1 10*3/uL (ref 0.0–0.1)
EOS PCT: 4.2 % (ref 0.0–5.0)
Eosinophils Absolute: 0.5 10*3/uL (ref 0.0–0.7)
HEMATOCRIT: 43.6 % (ref 39.0–52.0)
HEMOGLOBIN: 14.6 g/dL (ref 13.0–17.0)
LYMPHS ABS: 3.5 10*3/uL (ref 0.7–4.0)
LYMPHS PCT: 27.6 % (ref 12.0–46.0)
MCHC: 33.5 g/dL (ref 30.0–36.0)
MCV: 88.9 fl (ref 78.0–100.0)
MONOS PCT: 5.9 % (ref 3.0–12.0)
Monocytes Absolute: 0.7 10*3/uL (ref 0.1–1.0)
NEUTROS PCT: 61.8 % (ref 43.0–77.0)
Neutro Abs: 7.8 10*3/uL — ABNORMAL HIGH (ref 1.4–7.7)
PLATELETS: 285 10*3/uL (ref 150.0–400.0)
RBC: 4.9 Mil/uL (ref 4.22–5.81)
RDW: 13.7 % (ref 11.5–15.5)
WBC: 12.7 10*3/uL — AB (ref 4.0–10.5)

## 2014-10-08 LAB — COMPREHENSIVE METABOLIC PANEL
ALT: 18 U/L (ref 0–53)
AST: 17 U/L (ref 0–37)
Albumin: 4.2 g/dL (ref 3.5–5.2)
Alkaline Phosphatase: 144 U/L — ABNORMAL HIGH (ref 39–117)
BILIRUBIN TOTAL: 0.3 mg/dL (ref 0.2–1.2)
BUN: 13 mg/dL (ref 6–23)
CALCIUM: 9.2 mg/dL (ref 8.4–10.5)
CO2: 27 mEq/L (ref 19–32)
Chloride: 107 mEq/L (ref 96–112)
Creatinine, Ser: 1.01 mg/dL (ref 0.40–1.50)
GFR: 87.89 mL/min (ref >60.00)
Glucose, Bld: 95 mg/dL (ref 70–99)
Potassium: 4.4 mEq/L (ref 3.5–5.1)
SODIUM: 139 meq/L (ref 135–145)
Total Protein: 7 g/dL (ref 6.0–8.3)

## 2014-10-08 LAB — LDL CHOLESTEROL, DIRECT: Direct LDL: 131 mg/dL

## 2014-10-08 LAB — LIPID PANEL
CHOL/HDL RATIO: 7
CHOLESTEROL: 222 mg/dL — AB (ref 0–200)
HDL: 30.6 mg/dL — AB (ref >39.00)

## 2014-10-08 LAB — TSH: TSH: 1.89 u[IU]/mL (ref 0.35–4.50)

## 2014-10-13 ENCOUNTER — Encounter: Payer: Self-pay | Admitting: Family Medicine

## 2014-10-13 ENCOUNTER — Ambulatory Visit (INDEPENDENT_AMBULATORY_CARE_PROVIDER_SITE_OTHER): Payer: Commercial Managed Care - HMO | Admitting: Family Medicine

## 2014-10-13 VITALS — BP 128/78 | HR 84 | Temp 98.2°F | Ht 69.25 in | Wt 206.0 lb

## 2014-10-13 DIAGNOSIS — E663 Overweight: Secondary | ICD-10-CM

## 2014-10-13 DIAGNOSIS — F17200 Nicotine dependence, unspecified, uncomplicated: Secondary | ICD-10-CM

## 2014-10-13 DIAGNOSIS — Z0000 Encounter for general adult medical examination without abnormal findings: Secondary | ICD-10-CM | POA: Diagnosis not present

## 2014-10-13 DIAGNOSIS — Z23 Encounter for immunization: Secondary | ICD-10-CM | POA: Diagnosis not present

## 2014-10-13 DIAGNOSIS — E781 Pure hyperglyceridemia: Secondary | ICD-10-CM

## 2014-10-13 DIAGNOSIS — Z114 Encounter for screening for human immunodeficiency virus [HIV]: Secondary | ICD-10-CM

## 2014-10-13 NOTE — Assessment & Plan Note (Signed)
Discussed how this problem influences overall health and the risks it imposes  Reviewed plan for weight loss with lower calorie diet (via better food choices and also portion control or program like weight watchers) and exercise building up to or more than 30 minutes 5 days per week including some aerobic activity   Pt has lost 14 lb so far with better habits-enc him to keep it up

## 2014-10-13 NOTE — Assessment & Plan Note (Signed)
Disc goals for lipids and reasons to control them Rev labs with pt Rev low sat fat diet in detail Trig over 400  Pt plans to change diet- 2 handouts given  Re check this in 4-6 wk

## 2014-10-13 NOTE — Assessment & Plan Note (Addendum)
Disc in detail risks of smoking and possible outcomes including copd, vascular/ heart disease, cancer , respiratory and sinus infections  Pt voices understanding Pt cannot afford chantix so has cut down to 1/2 ppd and plans to try to quit cold [place] in the future  Not quite ready now -no quit date yet  Pneumonia vaccine today

## 2014-10-13 NOTE — Progress Notes (Signed)
Pre visit review using our clinic review tool, if applicable. No additional management support is needed unless otherwise documented below in the visit note. 

## 2014-10-13 NOTE — Assessment & Plan Note (Signed)
Reviewed health habits including diet and exercise and skin cancer prevention Reviewed appropriate screening tests for age  Also reviewed health mt list, fam hx and immunization status , as well as social and family history   Pt will work on smoking cessation  Rev labs in detail (suspect alk phos is from past etoh and/or current foot problem)-will watch, wbc is from recent prednisone   Rev chol - plan is diet change and re check  Will screen for HIV at that f/u

## 2014-10-13 NOTE — Progress Notes (Signed)
 Subjective:    Patient ID: Jonathan Choi, male    DOB: 01/19/1976, 38 y.o.   MRN: [id number]  HPI Here for health maintenance exam and to review chronic medical problems    Doing ok - feeling ok in general   Wt is down 14 lb with bmi of 30 Cut back on sodas - has made a big difference - gets one per day , and drinks a lot of water    Smoking status - down to a 1/2 ppd - has cut down a lot  No quit date yet  Insurance will not pay for chantix    HIV screen - is interested in that with next labs - but does not feel he is high risk    Flu shot 10/15 PNA -is interested in vaccine   Td 5/14  Prostate hx -no problems and no prostate cancer in the family  No nocturia   Alcohol intake - none at all since jan of 09   Having foot problems - (states xray showed no fx)  Has been to ortho ([facility])  Has had steroids/steroid shot and tramadol    BP Readings from Last 3 Encounters:  10/13/14 128/78  09/02/14 140/86  04/14/14 132/84    Cholesterol  Lab Results  Component Value Date   CHOL 222* 10/08/2014   Lab Results  Component Value Date   HDL 30.60* 10/08/2014   No results found for: LDLCALC Lab Results  Component Value Date   TRIG * 10/08/2014    474.0 Triglyceride is over 400; calculations on Lipids are invalid.   Lab Results  Component Value Date   CHOLHDL 7 10/08/2014   Lab Results  Component Value Date   LDLDIRECT 131.0 10/08/2014   Diet is not optimal for cholesterol  He is watching portions more than picking foods   Lab Results  Component Value Date   WBC 12.7* 10/08/2014   HGB 14.6 10/08/2014   HCT 43.6 10/08/2014   MCV 88.9 10/08/2014   PLT 285.0 10/08/2014   had recent steroids     Chemistry      Component Value Date/Time   NA 139 10/08/2014 0735   K 4.4 10/08/2014 0735   CL 107 10/08/2014 0735   CO2 27 10/08/2014 0735   BUN 13 10/08/2014 0735   CREATININE 1.01 10/08/2014 0735      Component Value Date/Time   CALCIUM 9.2  10/08/2014 0735   ALKPHOS 144* 10/08/2014 0735   AST 17 10/08/2014 0735   ALT 18 10/08/2014 0735   BILITOT 0.3 10/08/2014 0735     alk phos is a little elevated -has been in the past  Used to drink heavily  No hx of abdominal pain  Also having foot problems   Patient Active Problem List   Diagnosis Date Noted  . Hypertriglyceridemia 10/13/2014  . Screening for HIV (human immunodeficiency virus) 10/13/2014  . Routine general medical examination at a health care facility 10/07/2014  . Smoking 04/14/2014  . Overweight 04/14/2014  . Anal inflammation 04/14/2014   Past Medical History  Diagnosis Date  . Alcohol abuse   . Stress fracture 5/15    foot  . Smoker    No past surgical history on file. History  Substance Use Topics  . Smoking status: Current Every Day Smoker -- 0.50 packs/day    Types: Cigarettes  . Smokeless tobacco: Not on file  . Alcohol Use: No   Family History  Problem Relation Age of Onset  .   Hypertension Mother   . Cancer Father   . Diabetes Father   . Stroke Maternal Grandfather    Allergies  Allergen Reactions  . Cleocin [Clindamycin Hcl] Hives   No current outpatient prescriptions on file prior to visit.   No current facility-administered medications on file prior to visit.     Review of Systems Review of Systems  Constitutional: Negative for fever, appetite change, fatigue and unexpected weight change.  Eyes: Negative for pain and visual disturbance.  Respiratory: Negative for cough and shortness of breath.   Cardiovascular: Negative for cp or palpitations    Gastrointestinal: Negative for nausea, diarrhea and constipation.  Genitourinary: Negative for urgency and frequency.  Skin: Negative for pallor or rash   MSK pos for L dorsal foot pain  Neurological: Negative for weakness, light-headedness, numbness and headaches.  Hematological: Negative for adenopathy. Does not bruise/bleed easily.  Psychiatric/Behavioral: Negative for dysphoric  mood. The patient is not nervous/anxious.         Objective:   Physical Exam  Constitutional: He appears well-developed and well-nourished. No distress.  obese and well appearing   HENT:  Head: Normocephalic and atraumatic.  Right Ear: External ear normal.  Left Ear: External ear normal.  Nose: Nose normal.  Mouth/Throat: Oropharynx is clear and moist.  Eyes: Conjunctivae and EOM are normal. Pupils are equal, round, and reactive to light. Right eye exhibits no discharge. Left eye exhibits no discharge. No scleral icterus.  Neck: Normal range of motion. Neck supple. No JVD present. Carotid bruit is not present. No thyromegaly present.  Cardiovascular: Normal rate, regular rhythm, normal heart sounds and intact distal pulses.  Exam reveals no gallop.   Pulmonary/Chest: Effort normal and breath sounds normal. No respiratory distress. He has no wheezes. He exhibits no tenderness.  Lung sounds are mildly distant Good air exch  Abdominal: Soft. Bowel sounds are normal. He exhibits no distension, no abdominal bruit and no mass. There is no tenderness.  Musculoskeletal: He exhibits no edema or tenderness.  Lymphadenopathy:    He has no cervical adenopathy.  Neurological: He is alert. He has normal reflexes. No cranial nerve deficit. He exhibits normal muscle tone. Coordination normal.  Skin: Skin is warm and dry. No rash noted. No erythema. No pallor.  Psychiatric: He has a normal mood and affect.          Assessment & Plan:   Problem List Items Addressed This Visit    Hypertriglyceridemia    Disc goals for lipids and reasons to control them Rev labs with pt Rev low sat fat diet in detail Trig over 400  Pt plans to change diet- 2 handouts given  Re check this in 4-6 wk      Relevant Orders   Lipid panel   Hepatic function panel   Overweight    Discussed how this problem influences overall health and the risks it imposes  Reviewed plan for weight loss with lower calorie diet  (via better food choices and also portion control or program like weight watchers) and exercise building up to or more than 30 minutes 5 days per week including some aerobic activity   Pt has lost 14 lb so far with better habits-enc him to keep it up       Routine general medical examination at a health care facility    Reviewed health habits including diet and exercise and skin cancer prevention Reviewed appropriate screening tests for age  Also reviewed health mt list, fam hx and  immunization status , as well as social and family history   Pt will work on smoking cessation  Rev labs in detail (suspect alk phos is from past etoh and/or current foot problem)-will watch, wbc is from recent prednisone   Rev chol - plan is diet change and re check  Will screen for HIV at that f/u      Screening for HIV (human immunodeficiency virus)   Relevant Orders   HIV antibody (with reflex)   Smoking - Primary    Disc in detail risks of smoking and possible outcomes including copd, vascular/ heart disease, cancer , respiratory and sinus infections  Pt voices understanding Pt cannot afford chantix so has cut down to 1/2 ppd and plans to try to quit cold [place] in the future  Not quite ready now -no quit date yet  Pneumonia vaccine today       Other Visit Diagnoses    Need for 23-polyvalent pneumococcal polysaccharide vaccine        Relevant Orders    Pneumococcal polysaccharide vaccine 23-valent greater than or equal to 2yo subcutaneous/IM (Completed)

## 2014-10-13 NOTE — Patient Instructions (Signed)
Triglycerides are high -watch fat diet  Also HDL is low (that's your good cholesterol)- exercise helps this as well as omega 3 fish oil or eating fish  Schedule fasting labs in 4-6 weeks (I will add the HIV screen to that also)   Take care of yourself  Keep thinking about quitting smoking  Keep working on diet and weight loss

## 2014-10-20 ENCOUNTER — Telehealth: Payer: Self-pay | Admitting: Family Medicine

## 2014-10-20 DIAGNOSIS — E781 Pure hyperglyceridemia: Secondary | ICD-10-CM

## 2014-10-20 DIAGNOSIS — R6882 Decreased libido: Secondary | ICD-10-CM | POA: Insufficient documentation

## 2014-10-20 DIAGNOSIS — Z114 Encounter for screening for human immunodeficiency virus [HIV]: Secondary | ICD-10-CM

## 2014-10-20 NOTE — Telephone Encounter (Signed)
Insurance will need a symptom to link the test to ,since it is not part of a screening profile Common symptoms of low testosterone incl fatigue/decresed libido/ erectile dysfunction  Please let me know what he is experiencing so I can assign a code to it

## 2014-10-20 NOTE — Telephone Encounter (Signed)
See prev note. I called pt and he wanted his testosterone checked too. I asked pt if he was having any problems/ sxs that would make him feel like it was low and he said no he would just like it checked because he doesn't think he hasn't had it checked before

## 2014-10-20 NOTE — Addendum Note (Signed)
Addended by: Roxy Manns A on: 10/20/2014 09:27 PM   Modules accepted: Orders

## 2014-10-20 NOTE — Telephone Encounter (Signed)
Pt wants to know if he can test for something else when he has blood work done on Aug 8th? Best phone number is [phone]

## 2014-10-20 NOTE — Telephone Encounter (Signed)
I will add that

## 2014-10-20 NOTE — Telephone Encounter (Signed)
Pt said he is having decreased libido he just didn't know how to say that earlier.

## 2014-11-27 ENCOUNTER — Other Ambulatory Visit (INDEPENDENT_AMBULATORY_CARE_PROVIDER_SITE_OTHER): Payer: Commercial Managed Care - HMO

## 2014-11-27 DIAGNOSIS — E781 Pure hyperglyceridemia: Secondary | ICD-10-CM

## 2014-11-27 DIAGNOSIS — R7989 Other specified abnormal findings of blood chemistry: Secondary | ICD-10-CM | POA: Diagnosis not present

## 2014-11-27 DIAGNOSIS — Z114 Encounter for screening for human immunodeficiency virus [HIV]: Secondary | ICD-10-CM

## 2014-11-27 DIAGNOSIS — R6882 Decreased libido: Secondary | ICD-10-CM

## 2014-11-27 LAB — LIPID PANEL
Cholesterol: 227 mg/dL — ABNORMAL HIGH (ref 0–200)
HDL: 30.7 mg/dL — AB (ref >39.00)
NONHDL: 196.76
TRIGLYCERIDES: 380 mg/dL — AB (ref 0.0–149.0)
Total CHOL/HDL Ratio: 7
VLDL: 76 mg/dL — ABNORMAL HIGH (ref 0.0–40.0)

## 2014-11-27 LAB — COMPREHENSIVE METABOLIC PANEL
ALT: 25 U/L (ref 0–53)
AST: 17 U/L (ref 0–37)
Albumin: 4.1 g/dL (ref 3.5–5.2)
Alkaline Phosphatase: 136 U/L — ABNORMAL HIGH (ref 39–117)
BILIRUBIN TOTAL: 0.3 mg/dL (ref 0.2–1.2)
BUN: 12 mg/dL (ref 6–23)
CALCIUM: 9.3 mg/dL (ref 8.4–10.5)
CO2: 25 meq/L (ref 19–32)
Chloride: 109 mEq/L (ref 96–112)
Creatinine, Ser: 0.96 mg/dL (ref 0.40–1.50)
GFR: 93.12 mL/min (ref >60.00)
Glucose, Bld: 93 mg/dL (ref 70–99)
Potassium: 4.5 mEq/L (ref 3.5–5.1)
Sodium: 139 mEq/L (ref 135–145)
Total Protein: 6.8 g/dL (ref 6.0–8.3)

## 2014-11-27 LAB — TESTOSTERONE: Testosterone: 442.32 ng/dL (ref 300.00–890.00)

## 2014-11-27 LAB — LDL CHOLESTEROL, DIRECT: LDL DIRECT: 147 mg/dL

## 2014-11-28 ENCOUNTER — Telehealth: Payer: Self-pay | Admitting: Family Medicine

## 2014-11-28 LAB — HIV ANTIBODY (ROUTINE TESTING W REFLEX): HIV 1&2 Ab, 4th Generation: NONREACTIVE

## 2014-11-28 MED ORDER — FENOFIBRATE 145 MG PO TABS: 145.0000 mg | ORAL_TABLET | Freq: Every day | ORAL | Status: DC

## 2014-11-28 NOTE — Telephone Encounter (Signed)
-----   Message from Shon Millet, [place] sent at 11/28/2014 12:31 PM EDT ----- Pt notified of lab results and Dr. Royden Purl recommendations. Pt agrees with starting fenofibrate, he uses [facility] [employer]

## 2014-11-28 NOTE — Telephone Encounter (Signed)
I will send that  If any side effects like muscle aches/pains -stop it and update me   Schedule fasting lab in 6 wk please   Avoid fat in diet

## 2014-11-28 NOTE — Telephone Encounter (Signed)
Pt notified Rx sent and advised of Dr. Royden Purl comments, lab appt scheduled

## 2014-12-01 ENCOUNTER — Telehealth: Payer: Self-pay

## 2014-12-01 ENCOUNTER — Telehealth: Payer: Self-pay | Admitting: *Deleted

## 2014-12-01 MED ORDER — FENOFIBRATE 160 MG PO TABS: 160.0000 mg | ORAL_TABLET | Freq: Every day | ORAL | Status: DC

## 2014-12-01 NOTE — Telephone Encounter (Signed)
done

## 2014-12-01 NOTE — Telephone Encounter (Signed)
That sounds fine

## 2014-12-01 NOTE — Telephone Encounter (Signed)
Received fax saying that pt's fenofibrate  is not covered by insurance. We either have to do a Prior Auth or you can change the dose to one that is covered by insurance.  Pt's insurance covers strength  or  without having to do a PA. Do you want to switch to one of these strengths or do you want me to start the Prior Auth for the  dose, please advise

## 2014-12-01 NOTE — Telephone Encounter (Signed)
Please change to the 160- same inst  Thanks !

## 2014-12-01 NOTE — Telephone Encounter (Signed)
Pt wants to know what time of day to take the fenofibrate; instructions are take one daily; advised pt it does not matter what time of day pt takes med but it is important to take the med the same time every day so pt will maintain even level of med in blood stream. And med can be taken with or without food. Pt voiced understanding. FYI to Dr Milinda Antis for verification and if info given is OK with Dr Milinda Antis pt does not need cb.

## 2014-12-17 ENCOUNTER — Ambulatory Visit (INDEPENDENT_AMBULATORY_CARE_PROVIDER_SITE_OTHER): Payer: Commercial Managed Care - HMO | Admitting: Family Medicine

## 2014-12-17 ENCOUNTER — Encounter: Payer: Self-pay | Admitting: Family Medicine

## 2014-12-17 ENCOUNTER — Encounter (INDEPENDENT_AMBULATORY_CARE_PROVIDER_SITE_OTHER): Payer: Self-pay

## 2014-12-17 VITALS — BP 124/78 | HR 75 | Temp 98.0°F | Ht 69.25 in | Wt 217.0 lb

## 2014-12-17 DIAGNOSIS — M67439 Ganglion, unspecified wrist: Secondary | ICD-10-CM | POA: Insufficient documentation

## 2014-12-17 DIAGNOSIS — M67432 Ganglion, left wrist: Secondary | ICD-10-CM | POA: Diagnosis not present

## 2014-12-17 NOTE — Progress Notes (Signed)
Pre visit review using our clinic review tool, if applicable. No additional management support is needed unless otherwise documented below in the visit note. 

## 2014-12-17 NOTE — Progress Notes (Signed)
   Subjective:    Patient ID: Jonathan Choi, male    DOB: May 17, 1975, 39 y.o.   MRN: [id number]  HPI Here for a knot on his wrist (left) Noticed it Sunday - very small  Grown in size since then  Bothersome extend wrist  No redness or drainage -is hard   Dorsal wrist   Never had one before   Patient Active Problem List   Diagnosis Date Noted  . Low libido 10/20/2014  . Hypertriglyceridemia 10/13/2014  . Screening for HIV (human immunodeficiency virus) 10/13/2014  . Routine general medical examination at a health care facility 10/07/2014  . Smoking 04/14/2014  . Overweight 04/14/2014  . Anal inflammation 04/14/2014   Past Medical History  Diagnosis Date  . Alcohol abuse   . Stress fracture 5/15    foot  . Smoker    No past surgical history on file. Social History  Substance Use Topics  . Smoking status: Current Every Day Smoker -- 0.50 packs/day    Types: Cigarettes  . Smokeless tobacco: None  . Alcohol Use: No   Family History  Problem Relation Age of Onset  . Hypertension Mother   . Cancer Father   . Diabetes Father   . Stroke Maternal Grandfather    Allergies  Allergen Reactions  . Cleocin [Clindamycin Hcl] Hives   Current Outpatient Prescriptions on File Prior to Visit  Medication Sig Dispense Refill  . fenofibrate 160 MG tablet Take 1 tablet (160 mg total) by mouth daily. 90 tablet 3   No current facility-administered medications on file prior to visit.      Review of Systems    Review of Systems  Constitutional: Negative for fever, appetite change, fatigue and unexpected weight change.  Eyes: Negative for pain and visual disturbance.  Respiratory: Negative for cough and shortness of breath.   Cardiovascular: Negative for cp or palpitations    Gastrointestinal: Negative for nausea, diarrhea and constipation.  Genitourinary: Negative for urgency and frequency.  Skin: Negative for pallor or rash   Neurological: Negative for weakness,  light-headedness, numbness and headaches.  Hematological: Negative for adenopathy. Does not bruise/bleed easily.  Psychiatric/Behavioral: Negative for dysphoric mood. The patient is not nervous/anxious.      Objective:   Physical Exam  Constitutional: He appears well-developed and well-nourished. No distress.  overwt and well app   Neck: Normal range of motion. Neck supple.  Cardiovascular: Normal rate and regular rhythm.   Musculoskeletal: He exhibits tenderness. He exhibits no edema.  1 cm rubbery mass on dorsal L hand that is mobile  Not tender to the touch but painful when pt fully ext wrist  Nl rom wrist  No bony abnormality  Nl strength/perf  Lymphadenopathy:    He has no cervical adenopathy.  Neurological: He is alert. He has normal reflexes. He displays no atrophy.  Skin: Skin is warm and dry. No rash noted. No erythema.  Psychiatric: He has a normal mood and affect.          Assessment & Plan:   Problem List Items Addressed This Visit      Musculoskeletal and Integument   Ganglion cyst of wrist - Primary    Dorsal L wrist -hurts with full extension of wrist -bothersome since pt works with his hands  Ref to hand specialist for eval and treatment       Relevant Orders   Ambulatory referral to Orthopedic Surgery

## 2014-12-17 NOTE — Patient Instructions (Signed)
Stop at check out for referral for a hand specialist I think you have a ganglion cyst of your wrist

## 2014-12-18 NOTE — Assessment & Plan Note (Signed)
Dorsal L wrist -hurts with full extension of wrist -bothersome since pt works with his hands  Ref to hand specialist for eval and treatment

## 2014-12-30 ENCOUNTER — Telehealth: Payer: Self-pay

## 2014-12-30 NOTE — Telephone Encounter (Signed)
Pt left v/m requesting less expensive med to be substituted for fenofibrate 160 mg. Pt will ck with ins co to see if there is less expensive substitute and will cb with info.

## 2014-12-30 NOTE — Telephone Encounter (Signed)
Pt left v/m requesting cb about cholesterol med; left v/m requesting pt to call [facility].

## 2014-12-31 ENCOUNTER — Telehealth: Payer: Self-pay | Admitting: Family Medicine

## 2014-12-31 NOTE — Telephone Encounter (Signed)
Pt left v/m; pt request referral to orthopedist due to cannot afford to see hand specialist; pt was seen on 12/17/14 with ganglion on wrist. Pt request cb after 4:30 pm.

## 2014-12-31 NOTE — Telephone Encounter (Signed)
Please make him an appt with Dr Patsy Lageropland -he may be able to help aspirate the cyst if it can be done Thanks

## 2015-01-02 NOTE — Telephone Encounter (Signed)
appt scheduled with Dr. Copland 

## 2015-01-05 ENCOUNTER — Encounter: Payer: Self-pay | Admitting: Family Medicine

## 2015-01-05 ENCOUNTER — Ambulatory Visit (INDEPENDENT_AMBULATORY_CARE_PROVIDER_SITE_OTHER): Payer: Commercial Managed Care - HMO | Admitting: Family Medicine

## 2015-01-05 VITALS — BP 124/84 | HR 80 | Temp 97.9°F | Ht 69.25 in | Wt 217.5 lb

## 2015-01-05 DIAGNOSIS — M67432 Ganglion, left wrist: Secondary | ICD-10-CM | POA: Diagnosis not present

## 2015-01-05 DIAGNOSIS — M6740 Ganglion, unspecified site: Secondary | ICD-10-CM

## 2015-01-05 MED ORDER — METHYLPREDNISOLONE ACETATE 40 MG/ML IJ SUSP
20.0000 mg | Freq: Once | INTRAMUSCULAR | Status: AC
  Administered 2015-01-05: 20 mg via INTRA_ARTICULAR

## 2015-01-05 NOTE — Progress Notes (Addendum)
Dr. Karleen Hampshire T. Marlis Oldaker, MD, CAQ Sports [facility] [address] [place], [address] Phone: [phone] Fax: [phone]  01/05/2015  Patient: Jonathan Choi, MRN: [id number], DOB: [birth year], 39 y.o.  Primary Physician:  Roxy Manns, MD   Chief Complaint  Patient presents with  . Ganglion Cyst    Left   Subjective:   Jonathan Choi is a 39 y.o. very pleasant male patient who presents with the following:  L wrist, dorsum, ganglion. Pleasant gentleman seen for probable ganglion cyst on the dorsum of his left wrist. It is decreased in size in the last week compared to when he saw his primary care doctor. It is not really particularly painful, and he does not have any distal numbness, tingling, or grip strength. He does have to use his hands quite a bit at work.   Past Medical History, Surgical History, Social History, Family History, Problem List, Medications, and Allergies have been reviewed and updated if relevant.  Patient Active Problem List   Diagnosis Date Noted  . Ganglion cyst of wrist 12/17/2014  . Low libido 10/20/2014  . Hypertriglyceridemia 10/13/2014  . Screening for HIV (human immunodeficiency virus) 10/13/2014  . Routine general medical examination at a health care facility 10/07/2014  . Smoking 04/14/2014  . Overweight 04/14/2014  . Anal inflammation 04/14/2014    Past Medical History  Diagnosis Date  . Alcohol abuse   . Stress fracture 5/15    foot  . Smoker     No past surgical history on file.  Social History   Social History  . Marital Status: Married    Spouse Name: N/A  . Number of Children: N/A  . Years of Education: N/A   Occupational History  . Not on file.   Social History Main Topics  . Smoking status: Current Every Day Smoker -- 0.50 packs/day    Types: Cigarettes  . Smokeless tobacco: Former Neurosurgeon  . Alcohol Use: No  . Drug Use: No  . Sexual Activity: Not on file   Other Topics Concern    . Not on file   Social History Narrative   Married    3 children    Works in Control and instrumentation engineer       Former heavy smoker - quit in 2009    Family History  Problem Relation Age of Onset  . Hypertension Mother   . Cancer Father   . Diabetes Father   . Stroke Maternal Grandfather     Allergies  Allergen Reactions  . Cleocin [Clindamycin Hcl] Hives    Medication list reviewed and updated in full in [facility].  GEN: No fevers, chills. Nontoxic. Primarily MSK c/o today. MSK: Detailed in the HPI GI: tolerating PO intake without difficulty Neuro: No numbness, parasthesias, or tingling associated. Otherwise the pertinent positives of the ROS are noted above.   Objective:   BP 124/84 mmHg  Pulse 80  Temp(Src) 97.9 F (36.6 C) (Oral)  Ht 5' 9.25" (1.759 m)  Wt 217 lb 8 oz (98.657 kg)  BMI 31.89 kg/m2   GEN: WDWN, NAD, Non-toxic, Alert & Oriented x 3 HEENT: Atraumatic, Normocephalic.  Ears and Nose: No external deformity. EXTR: No clubbing/cyanosis/edema NEURO: Normal gait.  PSYCH: Normally interactive. Conversant. Not depressed or anxious appearing.  Calm demeanor.   Hand: L Ecchymosis or edema: neg ROM wrist/hand/digits/elbow: full  Carpals, MCP's, digits: NT Distal Ulna and Radius: NT Supination lift test: neg Ecchymosis or edema: neg  Cysts/nodules: cyst in dorsum of wrist, relatively small Finkelstein's test: neg Snuffbox tenderness: neg Scaphoid tubercle: NT Hook of Hamate: NT Resisted supination: NT Full composite fist Grip, all digits: 5/5 str No tenosynovitis Axial load test: neg Phalen's: neg Tinel's: neg Atrophy: neg  Hand sensation: intact   Radiology: Diagnostic Ultrasound Evaluation Terason t3000, MSK ultrasound, MSK probe Anatomy scanned: L wrist dorsum Indication: Pain Findings: On the dorsum of the wrist, there is an area of hypoechoic change that is distinctly different compared to the surrounding vasculature. This area  was marked for needle placement. The texture of this region would be marked with clear borders, but there is some hyperechoic relative change, which could be an atypical cyst versus a ganglion decreasing in size. Electronically Signed  By: Hannah BeatSpencer Ersel Wadleigh, MD On: 01/05/2015 2:12 PM   Assessment and Plan:   Ganglion cyst - Plan: methylPREDNISolone acetate (DEPO-MEDROL) injection 20 mg  Ganglion cyst of wrist, left  Probable ganglion or other cyst  Injection, Ganglion cyst, L The patient was verbally consented. Risks, benefits, and alternatives were discussed including lightening of the skin and potential infection. The patient was prepped with Chloraprep. Needle placement assisted by US for needle placement. A mixture of 1/2 cc of lidocaine 1% and Depo-Medrol 20 mg was injected into the ganglion cyst directly. An aspiration attempt was made, but no fluid / jelly was aspirated. No difficulty. No complications.   Follow-up: prn  Signed,  Kimothy Kishimoto T. Avilene Marrin, MD   Patient's Medications  New Prescriptions   No medications on file  Previous Medications   FENOFIBRATE 160 MG TABLET    Take 1 tablet (160 mg total) by mouth daily.  Modified Medications   No medications on file  Discontinued Medications   No medications on file

## 2015-01-05 NOTE — Progress Notes (Signed)
Pre visit review using our clinic review tool, if applicable. No additional management support is needed unless otherwise documented below in the visit note. 

## 2015-01-15 ENCOUNTER — Telehealth: Payer: Self-pay | Admitting: Family Medicine

## 2015-01-15 DIAGNOSIS — E781 Pure hyperglyceridemia: Secondary | ICD-10-CM

## 2015-01-15 NOTE — Telephone Encounter (Signed)
-----   Message from Alvina Chouerri J Walsh sent at 01/08/2015 12:27 PM EDT ----- Regarding: Lab orders for Friday, 10.28.16 Lab orders, no f/u appt

## 2015-01-16 ENCOUNTER — Other Ambulatory Visit (INDEPENDENT_AMBULATORY_CARE_PROVIDER_SITE_OTHER): Payer: Commercial Managed Care - HMO

## 2015-01-16 DIAGNOSIS — Z23 Encounter for immunization: Secondary | ICD-10-CM | POA: Diagnosis not present

## 2015-01-16 DIAGNOSIS — E781 Pure hyperglyceridemia: Secondary | ICD-10-CM

## 2015-01-16 LAB — LIPID PANEL
CHOLESTEROL: 209 mg/dL — AB (ref 0–200)
HDL: 33.9 mg/dL — ABNORMAL LOW (ref >39.00)
LDL CALC: 137 mg/dL — AB (ref 0–99)
NonHDL: 174.61
TRIGLYCERIDES: 188 mg/dL — AB (ref 0.0–149.0)
Total CHOL/HDL Ratio: 6
VLDL: 37.6 mg/dL (ref 0.0–40.0)

## 2015-01-16 LAB — ALT: ALT: 21 U/L (ref 0–53)

## 2015-01-16 LAB — AST: AST: 17 U/L (ref 0–37)

## 2015-02-11 NOTE — Telephone Encounter (Signed)
Unable to reach pt by phone; will wait for pt to cb about ins recommendation for substitute med for fenofibrate.

## 2015-03-04 ENCOUNTER — Ambulatory Visit (INDEPENDENT_AMBULATORY_CARE_PROVIDER_SITE_OTHER): Payer: Commercial Managed Care - HMO | Admitting: Primary Care

## 2015-03-04 ENCOUNTER — Encounter: Payer: Self-pay | Admitting: Primary Care

## 2015-03-04 VITALS — BP 118/68 | HR 88 | Temp 98.1°F | Wt 216.5 lb

## 2015-03-04 DIAGNOSIS — J020 Streptococcal pharyngitis: Secondary | ICD-10-CM

## 2015-03-04 DIAGNOSIS — J029 Acute pharyngitis, unspecified: Secondary | ICD-10-CM | POA: Diagnosis not present

## 2015-03-04 LAB — POCT RAPID STREP A (OFFICE): Rapid Strep A Screen: POSITIVE — AB

## 2015-03-04 MED ORDER — AMOXICILLIN 500 MG PO CAPS: 500.0000 mg | ORAL_CAPSULE | Freq: Two times a day (BID) | ORAL | Status: DC

## 2015-03-04 NOTE — Progress Notes (Signed)
Pre visit review using our clinic review tool, if applicable. No additional management support is needed unless otherwise documented below in the visit note. 

## 2015-03-04 NOTE — Progress Notes (Signed)
   Subjective:    Patient ID: Jonathan Choi, male    DOB: 03/10/1976, 39 y.o.   MRN: [id number]  HPI  Jonathan Choi is a 39 year old male who presents today with a chief complaint of sore throat. He is a current smoker (1/2 PPD). He also reports fever. He noticed his symptoms yesterday. He's been around his wife who was diagnosed with strep yesterday, who was placed on antibiotic treatment. He's not taken anything OTC for his symptoms.  Review of Systems  Constitutional: Positive for fever and chills.  HENT: Positive for congestion and sore throat. Negative for ear pain and sinus pressure.   Respiratory: Positive for cough.   Neurological: Positive for headaches.       Past Medical History  Diagnosis Date  . Alcohol abuse   . Stress fracture 5/15    foot  . Smoker     Social History   Social History  . Marital Status: Married    Spouse Name: N/A  . Number of Children: N/A  . Years of Education: N/A   Occupational History  . Not on file.   Social History Main Topics  . Smoking status: Current Every Day Smoker -- 0.50 packs/day    Types: Cigarettes  . Smokeless tobacco: Former NeurosurgeonUser  . Alcohol Use: No  . Drug Use: No  . Sexual Activity: Not on file   Other Topics Concern  . 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  Family History  Problem Relation Age of Onset  . Hypertension Mother   . Cancer Father   . Diabetes Father   . Stroke Maternal Grandfather     Allergies  Allergen Reactions  . Cleocin [Clindamycin Hcl] Hives    Current Outpatient Prescriptions on File Prior to Visit  Medication Sig Dispense Refill  . fenofibrate 160 MG tablet Take 1 tablet (160 mg total) by mouth daily. 90 tablet 3   No current facility-administered medications on file prior to visit.    BP 118/68 mmHg  Pulse 88  Temp(Src) 98.1 F  (36.7 C) (Oral)  Wt 216 lb 8 oz (98.204 kg)  SpO2 95%    Objective:   Physical Exam  Constitutional: He appears well-nourished.  HENT:  Right Ear: Ear canal normal. Tympanic membrane is bulging. Tympanic membrane is not erythematous.  Left Ear: Tympanic membrane and ear canal normal.  Nose: Right sinus exhibits no maxillary sinus tenderness and no frontal sinus tenderness. Left sinus exhibits no maxillary sinus tenderness and no frontal sinus tenderness.  Mouth/Throat: Oropharyngeal exudate, posterior oropharyngeal edema and posterior oropharyngeal erythema present.  Eyes: Conjunctivae are normal. Pupils are equal, round, and reactive to light.  Cardiovascular: Normal rate and regular rhythm.   Pulmonary/Chest: Effort normal and breath sounds normal.  Lymphadenopathy:    He has cervical adenopathy.  Skin: Skin is warm and dry.          Assessment & Plan:  Streptococcal Sore Throat:  Sore throat with fever since yesterday. Wife diagnosed with strep yesterday. Exam with erythema, exudate, and tenderness upon exam. + Cervical adenopathy. Rapid Strep: Positive Treat with Amoxicillin BID x 10 days. Ibuprofen, warm salt gargles, throat spray PRN. He is to follow up if no improvement.

## 2015-03-04 NOTE — Patient Instructions (Signed)
Start Amoxicillin antibiotics for strep throat. Take 1 tablet by mouth twice daily for 10 days.  You may take ibuprofen 600 mg three times daily as needed for pain and inflammation. You may also use chloraseptic spray, warm salt gargles, or throat lozenges for pain.  Increase consumption of water and rest. You are considered contagious until you've been on antibiotics for 24 hours.  Please call us if no improvement in symptoms.  It was a pleasure meeting you!  Strep Throat Strep throat is a bacterial infection of the throat. Your health care provider may call the infection tonsillitis or pharyngitis, depending on whether there is swelling in the tonsils or at the back of the throat. Strep throat is most common during the cold months of the year in children who are 575-38 years of age, but it can happen during any season in people of any age. This infection is spread from person to person (contagious) through coughing, sneezing, or close contact. CAUSES Strep throat is caused by the bacteria called Streptococcus pyogenes. RISK FACTORS This condition is more likely to develop in:  People who spend time in crowded places where the infection can spread easily.  People who have close contact with someone who has strep throat. SYMPTOMS Symptoms of this condition include:  Fever or chills.   Redness, swelling, or pain in the tonsils or throat.  Pain or difficulty when swallowing.  White or yellow spots on the tonsils or throat.  Swollen, tender glands in the neck or under the jaw.  Red rash all over the body (rare). DIAGNOSIS This condition is diagnosed by performing a rapid strep test or by taking a swab of your throat (throat culture test). Results from a rapid strep test are usually ready in a few minutes, but throat culture test results are available after one or two days. TREATMENT This condition is treated with antibiotic medicine. HOME CARE INSTRUCTIONS Medicines  Take  over-the-counter and prescription medicines only as told by your health care provider.  Take your antibiotic as told by your health care provider. Do not stop taking the antibiotic even if you start to feel better.  Have family members who also have a sore throat or fever tested for strep throat. They may need antibiotics if they have the strep infection. Eating and Drinking  Do not share food, drinking cups, or personal items that could cause the infection to spread to other people.  If swallowing is difficult, try eating soft foods until your sore throat feels better.  Drink enough fluid to keep your urine clear or pale yellow. General Instructions  Gargle with a salt-water mixture 3-4 times per day or as needed. To make a salt-water mixture, completely dissolve -1 tsp of salt in 1 cup of warm water.  Make sure that all household members wash their hands well.  Get plenty of rest.  Stay home from school or work until you have been taking antibiotics for 24 hours.  Keep all follow-up visits as told by your health care provider. This is important. SEEK MEDICAL CARE IF:  The glands in your neck continue to get bigger.  You develop a rash, cough, or earache.  You cough up a thick liquid that is green, yellow-brown, or bloody.  You have pain or discomfort that does not get better with medicine.  Your problems seem to be getting worse rather than better.  You have a fever. SEEK IMMEDIATE MEDICAL CARE IF:  You have new symptoms, such as vomiting,  severe headache, stiff or painful neck, chest pain, or shortness of breath.  You have severe throat pain, drooling, or changes in your voice.  You have swelling of the neck, or the skin on the neck becomes red and tender.  You have signs of dehydration, such as fatigue, dry mouth, and decreased urination.  You become increasingly sleepy, or you cannot wake up completely.  Your joints become red or painful.   This information is  not intended to replace advice given to you by your health care provider. Make sure you discuss any questions you have with your health care provider.   Document Released: 03/04/2000 Document Revised: 11/26/2014 Document Reviewed: 06/30/2014 Elsevier Interactive Patient Education [employer]

## 2015-03-26 MED FILL — FENOFIBRATE 160 MG TABLET: 160 | 30 days supply | Qty: 30 | Fill #3

## 2015-05-01 MED FILL — FENOFIBRATE 160 MG TABLET: 160 | 30 days supply | Qty: 30 | Fill #4

## 2015-05-20 ENCOUNTER — Ambulatory Visit (INDEPENDENT_AMBULATORY_CARE_PROVIDER_SITE_OTHER)
Admission: RE | Admit: 2015-05-20 | Discharge: 2015-05-20 | Disposition: A | Discharge status: Home / Self Care | Payer: Commercial Managed Care - HMO | Source: Ambulatory Visit | Attending: Primary Care | Admitting: Primary Care

## 2015-05-20 ENCOUNTER — Ambulatory Visit (INDEPENDENT_AMBULATORY_CARE_PROVIDER_SITE_OTHER): Payer: Commercial Managed Care - HMO | Admitting: Primary Care

## 2015-05-20 ENCOUNTER — Encounter: Payer: Self-pay | Admitting: Primary Care

## 2015-05-20 VITALS — BP 120/76 | HR 77 | Temp 97.5°F | Ht 69.25 in | Wt 217.0 lb

## 2015-05-20 DIAGNOSIS — M25531 Pain in right wrist: Secondary | ICD-10-CM

## 2015-05-20 NOTE — Progress Notes (Signed)
Pre visit review using our clinic review tool, if applicable. No additional management support is needed unless otherwise documented below in the visit note. 

## 2015-05-20 NOTE — Progress Notes (Signed)
Subjective:    Patient ID: Jonathan Choi, male    DOB: Nov 30, 1975, 40 y.o.   MRN: [id number]  HPI  Mr. Sellitto is a 40 year old male who presents today with a chief complaint of a wrist pain. His pain is located to the right anterior wrist. He does have a bonymass to his right wrist that has been present since childhood that was attributed to his rapid growth over a short period of time.   Over the past 2 weeks he's noticed pain, especially with flexion and rotation of the right wrist. He works with his hands every day at work. Denies numbness/tingling, changes in size of mass, recent injury/trauma. He's taken ibuprofen with some improvement.   Review of Systems  Constitutional: Negative for fever.  Musculoskeletal:       Right wrist pain  Skin: Negative for color change.       Bony mass to right anterior wrist unchanged.  Neurological: Negative for numbness.       Past Medical History  Diagnosis Date  . Alcohol abuse   . Stress fracture 5/15    foot  . Smoker     Social History   Social History  . Marital Status: Married    Spouse Name: N/A  . Number of Children: N/A  . Years of Education: N/A   Occupational History  . Not on file.   Social History Main Topics  . Smoking status: Current Every Day Smoker -- 0.50 packs/day    Types: Cigarettes  . Smokeless tobacco: Former Neurosurgeon  . Alcohol Use: No  . Drug Use: No  . Sexual Activity: Not on file   Other Topics Concern  . Not on file   Social History Narrative   Married    3 children    Works in Control and instrumentation engineer       Former heavy smoker - quit in 2009    No past surgical history on file.  Family History  Problem Relation Age of Onset  . Hypertension Mother   . Cancer Father   . Diabetes Father   . Stroke Maternal Grandfather     Allergies  Allergen Reactions  . Cleocin [Clindamycin Hcl] Hives    Current Outpatient Prescriptions on File Prior to Visit  Medication Sig Dispense Refill  .  fenofibrate 160 MG tablet Take 1 tablet (160 mg total) by mouth daily. 90 tablet 3   No current facility-administered medications on file prior to visit.    BP 120/76 mmHg  Pulse 77  Temp(Src) 97.5 F (36.4 C) (Oral)  Ht 5' 9.25" (1.759 m)  Wt 217 lb (98.431 kg)  BMI 31.81 kg/m2  SpO2 94%    Objective:   Physical Exam  Constitutional: He appears well-nourished.  Cardiovascular: Normal rate and regular rhythm.   Pulses:      Radial pulses are 2+ on the right side, and 2+ on the left side.  Pulmonary/Chest: Effort normal and breath sounds normal.  Musculoskeletal:       Right wrist: He exhibits decreased range of motion. He exhibits no bony tenderness and no swelling.  Pain with flexion and rotation during PROM.  Skin: No erythema.  2 cm diameter, bony mass to right anterior wrist.          Assessment & Plan:  Wrist Pain:  Located to right anterior wrist x 2 weeks. Bony mass present since childhood. Appears stable. No erythema, s/s of injection. Good pulses. Suspect pain due to overuse  of wrist. Will obtain xray due to presence of bony growth. Will treat with supportive measures: ibuprofen, provided wrist brace, rest. Discussed importance of rest if possible and to wear brace while at work. Stretching and ROM exercises at home at night. Return precautions provided.

## 2015-05-20 NOTE — Patient Instructions (Signed)
Complete xray(s) prior to leaving today. I will notify you of your results once received.  Continue ibuprofen. Take 600 mg three times daily as needed for pain and inflammation.  Wear the wrist brace while at work if possible to help stabilize the wrist. Make sure you take off the brace while at home to allow movement and prevent stiffness.  Please notify us if no improvement in pain, or if pain becomes worse.  It was a pleasure to see you today!

## 2015-05-27 ENCOUNTER — Telehealth: Payer: Self-pay

## 2015-05-27 NOTE — Telephone Encounter (Signed)
Keep clean with soap and water  Warm compresses may help  Follow up if worse or fever or other symptoms

## 2015-05-27 NOTE — Telephone Encounter (Signed)
Pt left v/m requesting cb to answer a question. I left v/m requesting cb.

## 2015-05-27 NOTE — Telephone Encounter (Signed)
Pt called back; pt has boil that has come up; has talked with Dr Milinda Antisower at [facility] about problems with boils; pt was referred to dermatologist before and the dermatologist did not do anything; the boil popped on its own. pt wants to take care of at home and does not want to come in for appt. Boil is located between scrotum and anus. Boil started on 05/25/15; hurts to walk and sit. No drainage. Pt has tried soaking in warm bath and kept warm cloth on area but no relief. [facility] outpt pharmacy.

## 2015-05-28 NOTE — Telephone Encounter (Signed)
Pt notified of Dr. Tower's comments/instructions and verbalized understanding 

## 2015-05-29 MED FILL — FENOFIBRATE 160 MG TABLET: 160 | 30 days supply | Qty: 30 | Fill #5

## 2015-07-02 MED FILL — FENOFIBRATE 160 MG TABLET: 160 | 30 days supply | Qty: 30 | Fill #6

## 2015-08-04 MED FILL — FENOFIBRATE 160 MG TABLET: 160 | 30 days supply | Qty: 30 | Fill #7

## 2015-09-07 MED FILL — FENOFIBRATE 160 MG TABLET: 160 | 30 days supply | Qty: 30 | Fill #8

## 2015-10-07 ENCOUNTER — Ambulatory Visit: Payer: Commercial Managed Care - HMO | Admitting: Internal Medicine

## 2015-10-07 ENCOUNTER — Encounter: Payer: Self-pay | Admitting: Internal Medicine

## 2015-10-07 ENCOUNTER — Ambulatory Visit (INDEPENDENT_AMBULATORY_CARE_PROVIDER_SITE_OTHER): Payer: Commercial Managed Care - HMO | Admitting: Internal Medicine

## 2015-10-07 VITALS — BP 130/80 | HR 78 | Temp 98.0°F | Wt 211.0 lb

## 2015-10-07 DIAGNOSIS — S86912A Strain of unspecified muscle(s) and tendon(s) at lower leg level, left leg, initial encounter: Secondary | ICD-10-CM | POA: Diagnosis not present

## 2015-10-07 NOTE — Assessment & Plan Note (Signed)
No sig ligament or meniscus findings Discussed heat --but use ice after a long day on it Okay to continue using the brace till pain better Ibuprofen prn

## 2015-10-07 NOTE — Progress Notes (Signed)
Pre visit review using our clinic review tool, if applicable. No additional management support is needed unless otherwise documented below in the visit note. 

## 2015-10-07 NOTE — Progress Notes (Signed)
   Subjective:    Patient ID: Jonathan Choi, male    DOB: 02/16/1976, 40 y.o.   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 May be helping some No obvious swelling in knee--but thinks there is some fluid in his calf  Current Outpatient Prescriptions on File Prior to Visit  Medication Sig Dispense Refill  . fenofibrate 160 MG tablet Take 1 tablet (160 mg total) by mouth daily. 90 tablet 3   No current facility-administered medications on file prior to visit.    Allergies  Allergen Reactions  . Cleocin [Clindamycin Hcl] Hives    Past Medical History  Diagnosis Date  . Alcohol abuse   . Stress fracture 5/15    foot  . Smoker     No past surgical history on file.  Family History  Problem Relation Age of Onset  . Hypertension Mother   . Cancer Father   . Diabetes Father   . Stroke Maternal Grandfather     Social History   Social History  . Marital Status: Married    Spouse Name: N/A  . Number of Children: N/A  . Years of Education: N/A   Occupational History  . Not on file.   Social History Main Topics  . Smoking status: Current Every Day Smoker -- 0.50 packs/day    Types: Cigarettes  . Smokeless tobacco: Former NeurosurgeonUser  . Alcohol Use: No  . Drug Use: No  . Sexual Activity: Not on file   Other Topics Concern  . 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

## 2015-10-07 NOTE — Patient Instructions (Signed)
Please continue to use the brace while working---you should probably ice your knee for 10-20 minutes after taking the brace. Try ibuprofen 600mg  (3 advil) up to three times daily till the pain is gone.

## 2015-10-08 MED FILL — FENOFIBRATE 160 MG TABLET: 160 | 30 days supply | Qty: 30 | Fill #9

## 2015-11-16 MED FILL — FENOFIBRATE 160 MG TABLET: 160 | 30 days supply | Qty: 30 | Fill #10

## 2015-11-30 ENCOUNTER — Ambulatory Visit (INDEPENDENT_AMBULATORY_CARE_PROVIDER_SITE_OTHER): Payer: Commercial Managed Care - HMO | Admitting: Family Medicine

## 2015-11-30 ENCOUNTER — Encounter: Payer: Self-pay | Admitting: Family Medicine

## 2015-11-30 VITALS — BP 98/64 | HR 70 | Temp 98.5°F | Ht 69.5 in | Wt 200.8 lb

## 2015-11-30 DIAGNOSIS — Z23 Encounter for immunization: Secondary | ICD-10-CM

## 2015-11-30 DIAGNOSIS — Z720 Tobacco use: Secondary | ICD-10-CM

## 2015-11-30 DIAGNOSIS — Z0000 Encounter for general adult medical examination without abnormal findings: Secondary | ICD-10-CM

## 2015-11-30 DIAGNOSIS — E663 Overweight: Secondary | ICD-10-CM

## 2015-11-30 DIAGNOSIS — E781 Pure hyperglyceridemia: Secondary | ICD-10-CM | POA: Diagnosis not present

## 2015-11-30 DIAGNOSIS — F17200 Nicotine dependence, unspecified, uncomplicated: Secondary | ICD-10-CM

## 2015-11-30 LAB — COMPREHENSIVE METABOLIC PANEL
ALBUMIN: 4.2 g/dL (ref 3.5–5.2)
ALK PHOS: 75 U/L (ref 39–117)
ALT: 20 U/L (ref 0–53)
AST: 17 U/L (ref 0–37)
BUN: 15 mg/dL (ref 6–23)
CALCIUM: 9 mg/dL (ref 8.4–10.5)
CHLORIDE: 107 meq/L (ref 96–112)
CO2: 24 mEq/L (ref 19–32)
CREATININE: 0.98 mg/dL (ref 0.40–1.50)
GFR: 90.45 mL/min (ref >60.00)
Glucose, Bld: 83 mg/dL (ref 70–99)
Potassium: 4.4 mEq/L (ref 3.5–5.1)
SODIUM: 137 meq/L (ref 135–145)
TOTAL PROTEIN: 7.3 g/dL (ref 6.0–8.3)
Total Bilirubin: 0.3 mg/dL (ref 0.2–1.2)

## 2015-11-30 LAB — CBC WITH DIFFERENTIAL/PLATELET
BASOS PCT: 0.4 % (ref 0.0–3.0)
Basophils Absolute: 0.1 10*3/uL (ref 0.0–0.1)
EOS PCT: 1.8 % (ref 0.0–5.0)
Eosinophils Absolute: 0.3 10*3/uL (ref 0.0–0.7)
HCT: 44.5 % (ref 39.0–52.0)
HEMOGLOBIN: 14.9 g/dL (ref 13.0–17.0)
LYMPHS ABS: 4.5 10*3/uL — AB (ref 0.7–4.0)
Lymphocytes Relative: 29.5 % (ref 12.0–46.0)
MCHC: 33.6 g/dL (ref 30.0–36.0)
MCV: 87.5 fl (ref 78.0–100.0)
MONO ABS: 1.1 10*3/uL — AB (ref 0.1–1.0)
Monocytes Relative: 7.2 % (ref 3.0–12.0)
Neutro Abs: 9.3 10*3/uL — ABNORMAL HIGH (ref 1.4–7.7)
Neutrophils Relative %: 61.1 % (ref 43.0–77.0)
Platelets: 336 10*3/uL (ref 150.0–400.0)
RBC: 5.08 Mil/uL (ref 4.22–5.81)
RDW: 14.1 % (ref 11.5–15.5)
WBC: 15.3 10*3/uL — AB (ref 4.0–10.5)

## 2015-11-30 LAB — TSH: TSH: 1.74 u[IU]/mL (ref 0.35–4.50)

## 2015-11-30 LAB — LIPID PANEL
CHOLESTEROL: 196 mg/dL (ref 0–200)
HDL: 34.5 mg/dL — ABNORMAL LOW (ref >39.00)
LDL CALC: 129 mg/dL — AB (ref 0–99)
NonHDL: 161.18
Total CHOL/HDL Ratio: 6
Triglycerides: 163 mg/dL — ABNORMAL HIGH (ref 0.0–149.0)
VLDL: 32.6 mg/dL (ref 0.0–40.0)

## 2015-11-30 MED ORDER — FENOFIBRATE 160 MG PO TABS: 160.0000 mg | ORAL_TABLET | Freq: Every day | ORAL | 3 refills | Status: DC

## 2015-11-30 NOTE — Assessment & Plan Note (Signed)
Disc in detail risks of smoking and possible outcomes including copd, vascular/ heart disease, cancer , respiratory and sinus infections  Pt voices understanding Pt is not yet ready to quit-will continue to think about it

## 2015-11-30 NOTE — Addendum Note (Signed)
Addended by: Baldomero LamyHAVERS, NATASHA C on: 11/30/2015 10:55 AM   Modules accepted: Orders

## 2015-11-30 NOTE — Assessment & Plan Note (Signed)
Lab today  Pt states he has been eating better/ less fried/fatty food Hope HDL will also be up from exercise

## 2015-11-30 NOTE — Assessment & Plan Note (Signed)
Reviewed health habits including diet and exercise and skin cancer prevention Reviewed appropriate screening tests for age  Also reviewed health mt list, fam hx and immunization status , as well as social and family history   See HPI Labs today  Flu shot today  Disc smoking cessation

## 2015-11-30 NOTE — Progress Notes (Signed)
Pre visit review using our clinic review tool, if applicable. No additional management support is needed unless otherwise documented below in the visit note. 

## 2015-11-30 NOTE — Assessment & Plan Note (Signed)
Commended wt loss so far with better diet choices and more exercise Discussed how this problem influences overall health and the risks it imposes  Reviewed plan for weight loss with lower calorie diet (via better food choices and also portion control or program like weight watchers) and exercise building up to or more than 30 minutes 5 days per week including some aerobic activity

## 2015-11-30 NOTE — Progress Notes (Signed)
Subjective:    Patient ID: Jonathan Choi, male    DOB: 1975-08-11, 40 y.o.   MRN: [id number]  HPI Here for health maintenance exam and to review chronic medical problems    Feeling ok  Working 2 jobs these days   Had a flu shot today   Tetanus shot 5/14  HIV screen neg 9/16  Wt Readings from Last 3 Encounters:  11/30/15 200 lb 12 oz (91.1 kg)  10/07/15 211 lb (95.7 kg)  05/20/15 217 lb (98.4 kg)  very active/working actively this summer - really helped  Feels better thinner  Tries to make healthy food choices also (avoiding fried foods/convenience eating) bmi is 29.2  Due for labs-hx of hypertriglyceridemia and low HDL Takes fenofibrate  Lab Results  Component Value Date   CHOL 209 (H) 01/16/2015   HDL 33.90 (L) 01/16/2015   LDLCALC 137 (H) 01/16/2015   LDLDIRECT 147.0 11/27/2014   TRIG 188.0 (H) 01/16/2015   CHOLHDL 6 01/16/2015  hopes it will will look good today- eating better and getting more exercise  No recent missed doses    Smoking status - about the same  1/2 ppd  No plans to quit yet = has tried in the past    Due for labs - will do today   Family history - father had lung cancer  No colon or prostate cancer   No prostate problems No nocturia  No problems emptying bladder  Patient Active Problem List   Diagnosis Date Noted  . Ganglion cyst of wrist 12/17/2014  . Low libido 10/20/2014  . Hypertriglyceridemia 10/13/2014  . Screening for HIV (human immunodeficiency virus) 10/13/2014  . Routine general medical examination at a health care facility 10/07/2014  . Smoking 04/14/2014  . Overweight 04/14/2014   Past Medical History:  Diagnosis Date  . Alcohol abuse   . Smoker   . Stress fracture 5/15   foot   No past surgical history on file. Social History  Substance Use Topics  . Smoking status: Current Every Day Smoker    Packs/day: 0.50    Types: Cigarettes  . Smokeless tobacco: Former Neurosurgeon  . Alcohol use No   Family History    Problem Relation Age of Onset  . Hypertension Mother   . Cancer Father   . Diabetes Father   . Stroke Maternal Grandfather    Allergies  Allergen Reactions  . Cleocin [Clindamycin Hcl] Hives   No current outpatient prescriptions on file prior to visit.   No current facility-administered medications on file prior to visit.     Review of Systems Review of Systems  Constitutional: Negative for fever, appetite change, fatigue and unexpected weight change.  Eyes: Negative for pain and visual disturbance.  Respiratory: Negative for cough and shortness of breath.   Cardiovascular: Negative for cp or palpitations    Gastrointestinal: Negative for nausea, diarrhea and constipation.  Genitourinary: Negative for urgency and frequency.  Skin: Negative for pallor or rash   Neurological: Negative for weakness, light-headedness, numbness and headaches.  Hematological: Negative for adenopathy. Does not bruise/bleed easily.  Psychiatric/Behavioral: Negative for dysphoric mood. The patient is not nervous/anxious.         Objective:   Physical Exam  Constitutional: He appears well-developed and well-nourished. No distress.  overwt and well appearing   HENT:  Head: Normocephalic and atraumatic.  Right Ear: External ear normal.  Left Ear: External ear normal.  Nose: Nose normal.  Mouth/Throat: Oropharynx is clear and moist.  Eyes: Conjunctivae and EOM are normal. Pupils are equal, round, and reactive to light. Right eye exhibits no discharge. Left eye exhibits no discharge. No scleral icterus.  Neck: Normal range of motion. Neck supple. No JVD present. Carotid bruit is not present. No thyromegaly present.  Cardiovascular: Normal rate, regular rhythm, normal heart sounds and intact distal pulses.  Exam reveals no gallop.   Pulmonary/Chest: Effort normal and breath sounds normal. No respiratory distress. He has no wheezes. He exhibits no tenderness.  Good air exch despite hx of smoking   Abdominal: Soft. Bowel sounds are normal. He exhibits no distension, no abdominal bruit and no mass. There is no tenderness.  Musculoskeletal: He exhibits no edema or tenderness.  Lymphadenopathy:    He has no cervical adenopathy.  Neurological: He is alert. He has normal reflexes. No cranial nerve deficit. He exhibits normal muscle tone. Coordination normal.  Skin: Skin is warm and dry. No rash noted. No erythema. No pallor.  Tanned  Some lentigines Skin tag in L axilla  Psychiatric: He has a normal mood and affect.          Assessment & Plan:   Problem List Items Addressed This Visit      Other   Smoking    Disc in detail risks of smoking and possible outcomes including copd, vascular/ heart disease, cancer , respiratory and sinus infections  Pt voices understanding Pt is not yet ready to quit-will continue to think about it       Routine general medical examination at a health care facility - Primary    Reviewed health habits including diet and exercise and skin cancer prevention Reviewed appropriate screening tests for age  Also reviewed health mt list, fam hx and immunization status , as well as social and family history   See HPI Labs today  Flu shot today  Disc smoking cessation        Relevant Orders   Comprehensive metabolic panel   CBC with Differential/Platelet   Lipid panel   TSH   Overweight    Commended wt loss so far with better diet choices and more exercise Discussed how this problem influences overall health and the risks it imposes  Reviewed plan for weight loss with lower calorie diet (via better food choices and also portion control or program like weight watchers) and exercise building up to or more than 30 minutes 5 days per week including some aerobic activity         Hypertriglyceridemia    Lab today  Pt states he has been eating better/ less fried/fatty food Hope HDL will also be up from exercise       Relevant Medications    fenofibrate 160 MG tablet    Other Visit Diagnoses    Need for influenza vaccination       Relevant Orders   Flu Vaccine QUAD 36+ mos IM (Completed)

## 2015-11-30 NOTE — Patient Instructions (Signed)
Take care of yourself  Eat a healthy diet  Keep thinking about quitting smoking  Labs today  Flu shot today

## 2015-12-01 ENCOUNTER — Telehealth: Payer: Self-pay | Admitting: Family Medicine

## 2015-12-01 DIAGNOSIS — D72829 Elevated white blood cell count, unspecified: Secondary | ICD-10-CM | POA: Insufficient documentation

## 2015-12-01 NOTE — Telephone Encounter (Signed)
-----   Message from Shon MilletShapale M [place], [place]MA sent at 12/01/2015  4:38 PM EDT ----- Pt notified of lab results and Dr. Royden Purlower's comments. Pt said he hasn't been sick at all and he hasn't been on any steroids. Pt is coming back in tomorrow at 5pm to do a UA, cxray and CBC with path, please put lab orders and xray in

## 2015-12-01 NOTE — Telephone Encounter (Signed)
Orders are done

## 2015-12-02 ENCOUNTER — Other Ambulatory Visit (INDEPENDENT_AMBULATORY_CARE_PROVIDER_SITE_OTHER): Payer: Commercial Managed Care - HMO

## 2015-12-02 ENCOUNTER — Ambulatory Visit (INDEPENDENT_AMBULATORY_CARE_PROVIDER_SITE_OTHER)
Admission: RE | Admit: 2015-12-02 | Discharge: 2015-12-02 | Disposition: A | Discharge status: Home / Self Care | Payer: Commercial Managed Care - HMO | Source: Ambulatory Visit | Attending: Family Medicine | Admitting: Family Medicine

## 2015-12-02 DIAGNOSIS — D72829 Elevated white blood cell count, unspecified: Secondary | ICD-10-CM

## 2015-12-02 LAB — POC URINALSYSI DIPSTICK (AUTOMATED)
BILIRUBIN UA: NEGATIVE
Blood, UA: NEGATIVE
GLUCOSE UA: NEGATIVE
KETONES UA: NEGATIVE
LEUKOCYTES UA: NEGATIVE
NITRITE UA: NEGATIVE
Protein, UA: NEGATIVE
Spec Grav, UA: >=1.03
Urobilinogen, UA: NEGATIVE
pH, UA: 6

## 2015-12-02 NOTE — Addendum Note (Signed)
Addended by: Tawnya CrookSAMBATH, Runa Whittingham on: 12/02/2015 05:55 PM   Modules accepted: Orders

## 2015-12-03 LAB — CBC WITH DIFFERENTIAL/PLATELET
BASOS PCT: 1.4 % (ref 0.0–3.0)
Basophils Absolute: 0.2 10*3/uL — ABNORMAL HIGH (ref 0.0–0.1)
EOS PCT: 2.8 % (ref 0.0–5.0)
Eosinophils Absolute: 0.3 10*3/uL (ref 0.0–0.7)
HEMATOCRIT: 41.3 % (ref 39.0–52.0)
Hemoglobin: 13.9 g/dL (ref 13.0–17.0)
LYMPHS ABS: 3.9 10*3/uL (ref 0.7–4.0)
LYMPHS PCT: 31.4 % (ref 12.0–46.0)
MCHC: 33.6 g/dL (ref 30.0–36.0)
MCV: 87.8 fl (ref 78.0–100.0)
MONOS PCT: 6.1 % (ref 3.0–12.0)
Monocytes Absolute: 0.8 10*3/uL (ref 0.1–1.0)
NEUTROS ABS: 7.2 10*3/uL (ref 1.4–7.7)
NEUTROS PCT: 58.3 % (ref 43.0–77.0)
PLATELETS: 301 10*3/uL (ref 150.0–400.0)
RBC: 4.71 Mil/uL (ref 4.22–5.81)
RDW: 14 % (ref 11.5–15.5)
WBC: 12.4 10*3/uL — ABNORMAL HIGH (ref 4.0–10.5)

## 2015-12-03 LAB — PATHOLOGIST SMEAR REVIEW

## 2015-12-09 ENCOUNTER — Encounter: Payer: Self-pay | Admitting: Family Medicine

## 2015-12-09 DIAGNOSIS — M67432 Ganglion, left wrist: Secondary | ICD-10-CM

## 2015-12-11 NOTE — Telephone Encounter (Signed)
Referral to ortho for ganglion cyst of wrist Will forward to [facility]

## 2015-12-15 ENCOUNTER — Other Ambulatory Visit: Payer: Self-pay | Admitting: Orthopedic Surgery

## 2015-12-15 MED FILL — FENOFIBRATE 160 MG TABLET: 160 | 30 days supply | Qty: 30 | Fill #0

## 2015-12-16 ENCOUNTER — Encounter (HOSPITAL_BASED_OUTPATIENT_CLINIC_OR_DEPARTMENT_OTHER): Payer: Self-pay | Admitting: *Deleted

## 2015-12-20 DIAGNOSIS — M67432 Ganglion, left wrist: Secondary | ICD-10-CM

## 2015-12-20 HISTORY — DX: Ganglion, left wrist: M67.432

## 2015-12-22 ENCOUNTER — Encounter (HOSPITAL_BASED_OUTPATIENT_CLINIC_OR_DEPARTMENT_OTHER): Payer: Self-pay | Admitting: *Deleted

## 2015-12-22 DIAGNOSIS — S80811A Abrasion, right lower leg, initial encounter: Secondary | ICD-10-CM

## 2015-12-22 HISTORY — DX: Abrasion, right lower leg, initial encounter: S80.811A

## 2015-12-29 ENCOUNTER — Ambulatory Visit (HOSPITAL_BASED_OUTPATIENT_CLINIC_OR_DEPARTMENT_OTHER): Payer: Commercial Managed Care - HMO | Admitting: Anesthesiology

## 2015-12-29 ENCOUNTER — Encounter (HOSPITAL_BASED_OUTPATIENT_CLINIC_OR_DEPARTMENT_OTHER): Payer: Self-pay | Admitting: Anesthesiology

## 2015-12-29 ENCOUNTER — Encounter (HOSPITAL_BASED_OUTPATIENT_CLINIC_OR_DEPARTMENT_OTHER)
Admission: RE | Disposition: A | Discharge status: Home / Self Care | Payer: Self-pay | Source: Ambulatory Visit | Attending: Orthopedic Surgery

## 2015-12-29 ENCOUNTER — Ambulatory Visit (HOSPITAL_BASED_OUTPATIENT_CLINIC_OR_DEPARTMENT_OTHER)
Admission: RE | Admit: 2015-12-29 | Discharge: 2015-12-29 | Disposition: A | Discharge status: Home / Self Care | Payer: Commercial Managed Care - HMO | Source: Ambulatory Visit | Attending: Orthopedic Surgery | Admitting: Orthopedic Surgery

## 2015-12-29 DIAGNOSIS — M67432 Ganglion, left wrist: Secondary | ICD-10-CM | POA: Diagnosis not present

## 2015-12-29 DIAGNOSIS — E781 Pure hyperglyceridemia: Secondary | ICD-10-CM | POA: Diagnosis not present

## 2015-12-29 DIAGNOSIS — F17210 Nicotine dependence, cigarettes, uncomplicated: Secondary | ICD-10-CM | POA: Diagnosis not present

## 2015-12-29 HISTORY — DX: Dental restoration status: Z98.811

## 2015-12-29 HISTORY — PX: MASS EXCISION: SHX2000

## 2015-12-29 HISTORY — DX: Ganglion, left wrist: M67.432

## 2015-12-29 HISTORY — DX: Pure hyperglyceridemia: E78.1

## 2015-12-29 HISTORY — DX: Abrasion, right lower leg, initial encounter: S80.811A

## 2015-12-29 SURGERY — EXCISION MASS
Anesthesia: General | Site: Wrist | Laterality: Left

## 2015-12-29 MED ORDER — MIDAZOLAM HCL 2 MG/2ML IJ SOLN: 1.0000 mg | INTRAMUSCULAR | Status: DC | PRN

## 2015-12-29 MED ORDER — MIDAZOLAM HCL 5 MG/5ML IJ SOLN
INTRAMUSCULAR | Status: DC | PRN
  Administered 2015-12-29: 2 mg via INTRAVENOUS

## 2015-12-29 MED ORDER — DEXAMETHASONE SODIUM PHOSPHATE 10 MG/ML IJ SOLN
INTRAMUSCULAR | Status: AC
  Filled 2015-12-29: qty 1

## 2015-12-29 MED ORDER — FENTANYL CITRATE (PF) 100 MCG/2ML IJ SOLN
INTRAMUSCULAR | Status: AC
  Filled 2015-12-29: qty 2

## 2015-12-29 MED ORDER — KETOROLAC TROMETHAMINE 30 MG/ML IJ SOLN: 30.0000 mg | Freq: Once | INTRAMUSCULAR | Status: DC | PRN

## 2015-12-29 MED ORDER — GLYCOPYRROLATE 0.2 MG/ML IJ SOLN: 0.2000 mg | Freq: Once | INTRAMUSCULAR | Status: DC | PRN

## 2015-12-29 MED ORDER — HYDROCODONE-ACETAMINOPHEN 7.5-325 MG PO TABS: 1.0000 | ORAL_TABLET | Freq: Once | ORAL | Status: DC | PRN

## 2015-12-29 MED ORDER — PROPOFOL 10 MG/ML IV BOLUS
INTRAVENOUS | Status: AC
  Filled 2015-12-29: qty 20

## 2015-12-29 MED ORDER — ONDANSETRON HCL 4 MG/2ML IJ SOLN
INTRAMUSCULAR | Status: AC
  Filled 2015-12-29: qty 2

## 2015-12-29 MED ORDER — HYDROCODONE-ACETAMINOPHEN 5-325 MG PO TABS: ORAL_TABLET | ORAL | 0 refills | Status: DC

## 2015-12-29 MED ORDER — FENTANYL CITRATE (PF) 100 MCG/2ML IJ SOLN
INTRAMUSCULAR | Status: DC | PRN
  Administered 2015-12-29: 50 ug via INTRAVENOUS
  Administered 2015-12-29: 100 ug via INTRAVENOUS
  Administered 2015-12-29: 50 ug via INTRAVENOUS

## 2015-12-29 MED ORDER — CEFAZOLIN SODIUM-DEXTROSE 2-4 GM/100ML-% IV SOLN
INTRAVENOUS | Status: AC
  Filled 2015-12-29: qty 100

## 2015-12-29 MED ORDER — FENTANYL CITRATE (PF) 100 MCG/2ML IJ SOLN: 50.0000 ug | INTRAMUSCULAR | Status: DC | PRN

## 2015-12-29 MED ORDER — PROMETHAZINE HCL 25 MG/ML IJ SOLN: 6.2500 mg | INTRAMUSCULAR | Status: DC | PRN

## 2015-12-29 MED ORDER — CHLORHEXIDINE GLUCONATE 4 % EX LIQD: 60.0000 mL | Freq: Once | CUTANEOUS | Status: DC

## 2015-12-29 MED ORDER — LACTATED RINGERS IV SOLN
INTRAVENOUS | Status: DC
  Administered 2015-12-29 (×2): via INTRAVENOUS

## 2015-12-29 MED ORDER — SCOPOLAMINE 1 MG/3DAYS TD PT72: 1.0000 | MEDICATED_PATCH | Freq: Once | TRANSDERMAL | Status: DC | PRN

## 2015-12-29 MED ORDER — DEXAMETHASONE SODIUM PHOSPHATE 10 MG/ML IJ SOLN
INTRAMUSCULAR | Status: DC | PRN
  Administered 2015-12-29: 10 mg via INTRAVENOUS

## 2015-12-29 MED ORDER — CEFAZOLIN SODIUM-DEXTROSE 2-4 GM/100ML-% IV SOLN
2.0000 g | INTRAVENOUS | Status: AC
  Administered 2015-12-29: 2 g via INTRAVENOUS

## 2015-12-29 MED ORDER — LIDOCAINE HCL (CARDIAC) 20 MG/ML IV SOLN
INTRAVENOUS | Status: DC | PRN
  Administered 2015-12-29: 50 mg via INTRAVENOUS

## 2015-12-29 MED ORDER — ONDANSETRON HCL 4 MG/2ML IJ SOLN
INTRAMUSCULAR | Status: DC | PRN
Start: 2015-12-29 — End: 2015-12-29
  Administered 2015-12-29: 4 mg via INTRAVENOUS

## 2015-12-29 MED ORDER — PROPOFOL 10 MG/ML IV BOLUS
INTRAVENOUS | Status: DC | PRN
  Administered 2015-12-29: 200 mg via INTRAVENOUS

## 2015-12-29 MED ORDER — BUPIVACAINE HCL (PF) 0.25 % IJ SOLN
INTRAMUSCULAR | Status: DC | PRN
  Administered 2015-12-29: 7 mL

## 2015-12-29 MED ORDER — MIDAZOLAM HCL 2 MG/2ML IJ SOLN
INTRAMUSCULAR | Status: AC
  Filled 2015-12-29: qty 2

## 2015-12-29 MED ORDER — LIDOCAINE 2% (20 MG/ML) 5 ML SYRINGE
INTRAMUSCULAR | Status: AC
  Filled 2015-12-29: qty 5

## 2015-12-29 MED FILL — HYDROCODON-APAP 5-325: 5-325 | 3 days supply | Qty: 20 | Fill #0

## 2015-12-29 SURGICAL SUPPLY — 55 items
BANDAGE ACE 3X5.8 VEL STRL LF (GAUZE/BANDAGES/DRESSINGS) ×2 IMPLANT
BANDAGE COBAN STERILE 2 (GAUZE/BANDAGES/DRESSINGS) IMPLANT
BENZOIN TINCTURE PRP APPL 2/3 (GAUZE/BANDAGES/DRESSINGS) ×2 IMPLANT
BLADE MINI RND TIP GREEN BEAV (BLADE) IMPLANT
BLADE SURG 15 STRL LF DISP TIS (BLADE) ×2 IMPLANT
BLADE SURG 15 STRL SS (BLADE) ×2
BNDG COHESIVE 1X5 TAN STRL LF (GAUZE/BANDAGES/DRESSINGS) IMPLANT
BNDG CONFORM 2 STRL LF (GAUZE/BANDAGES/DRESSINGS) IMPLANT
BNDG ELASTIC 2X5.8 VLCR STR LF (GAUZE/BANDAGES/DRESSINGS) IMPLANT
BNDG ESMARK 4X9 LF (GAUZE/BANDAGES/DRESSINGS) ×2 IMPLANT
BNDG GAUZE 1X2.1 STRL (MISCELLANEOUS) IMPLANT
BNDG GAUZE ELAST 4 BULKY (GAUZE/BANDAGES/DRESSINGS) ×2 IMPLANT
BNDG PLASTER X FAST 3X3 WHT LF (CAST SUPPLIES) ×20 IMPLANT
CHLORAPREP W/TINT 26ML (MISCELLANEOUS) ×2 IMPLANT
CORDS BIPOLAR (ELECTRODE) ×2 IMPLANT
COVER BACK TABLE 60X90IN (DRAPES) ×2 IMPLANT
COVER MAYO STAND STRL (DRAPES) ×2 IMPLANT
CUFF TOURNIQUET SINGLE 18IN (TOURNIQUET CUFF) ×2 IMPLANT
DRAPE EXTREMITY T 121X128X90 (DRAPE) ×2 IMPLANT
DRAPE SURG 17X23 STRL (DRAPES) ×2 IMPLANT
GAUZE SPONGE 4X4 12PLY STRL (GAUZE/BANDAGES/DRESSINGS) ×2 IMPLANT
GAUZE XEROFORM 1X8 LF (GAUZE/BANDAGES/DRESSINGS) ×2 IMPLANT
GLOVE BIO SURGEON STRL SZ7.5 (GLOVE) ×2 IMPLANT
GLOVE BIOGEL PI IND STRL 7.0 (GLOVE) ×1 IMPLANT
GLOVE BIOGEL PI IND STRL 8 (GLOVE) ×1 IMPLANT
GLOVE BIOGEL PI IND STRL 8.5 (GLOVE) IMPLANT
GLOVE BIOGEL PI INDICATOR 7.0 (GLOVE) ×1
GLOVE BIOGEL PI INDICATOR 8 (GLOVE) ×1
GLOVE BIOGEL PI INDICATOR 8.5 (GLOVE)
GLOVE ECLIPSE 6.5 STRL STRAW (GLOVE) ×2 IMPLANT
GLOVE SURG ORTHO 8.0 STRL STRW (GLOVE) IMPLANT
GOWN STRL REUS W/ TWL LRG LVL3 (GOWN DISPOSABLE) ×1 IMPLANT
GOWN STRL REUS W/TWL LRG LVL3 (GOWN DISPOSABLE) ×1
GOWN STRL REUS W/TWL XL LVL3 (GOWN DISPOSABLE) ×2 IMPLANT
NEEDLE HYPO 25X1 1.5 SAFETY (NEEDLE) ×2 IMPLANT
NS IRRIG 1000ML POUR BTL (IV SOLUTION) ×2 IMPLANT
PACK BASIN DAY SURGERY FS (CUSTOM PROCEDURE TRAY) ×2 IMPLANT
PAD CAST 3X4 CTTN HI CHSV (CAST SUPPLIES) ×1 IMPLANT
PAD CAST 4YDX4 CTTN HI CHSV (CAST SUPPLIES) IMPLANT
PADDING CAST ABS 4INX4YD NS (CAST SUPPLIES)
PADDING CAST ABS COTTON 4X4 ST (CAST SUPPLIES) IMPLANT
PADDING CAST COTTON 3X4 STRL (CAST SUPPLIES) ×1
PADDING CAST COTTON 4X4 STRL (CAST SUPPLIES)
STOCKINETTE 4X48 STRL (DRAPES) ×2 IMPLANT
STRIP CLOSURE SKIN 1/2X4 (GAUZE/BANDAGES/DRESSINGS) ×2 IMPLANT
SUT ETHILON 3 0 PS 1 (SUTURE) IMPLANT
SUT ETHILON 4 0 PS 2 18 (SUTURE) ×2 IMPLANT
SUT ETHILON 5 0 P 3 18 (SUTURE)
SUT MNCRL AB 4-0 PS2 18 (SUTURE) ×2 IMPLANT
SUT NYLON ETHILON 5-0 P-3 1X18 (SUTURE) IMPLANT
SUT VIC AB 4-0 P2 18 (SUTURE) ×2 IMPLANT
SYR BULB 3OZ (MISCELLANEOUS) ×2 IMPLANT
SYR CONTROL 10ML LL (SYRINGE) ×2 IMPLANT
TOWEL OR 17X24 6PK STRL BLUE (TOWEL DISPOSABLE) ×4 IMPLANT
UNDERPAD 30X30 (UNDERPADS AND DIAPERS) IMPLANT

## 2015-12-29 NOTE — Discharge Instructions (Addendum)

## 2015-12-29 NOTE — Transfer of Care (Signed)
Immediate Anesthesia Transfer of Care Note  Patient: Jonathan Choi  Procedure(s) Performed: Procedure(s): EXCISION MASS, left wrist (Left)  Patient Location: PACU  Anesthesia Type:General  Level of Consciousness: awake and patient cooperative  Airway & Oxygen Therapy: Patient Spontanous Breathing and Patient connected to face mask oxygen  Post-op Assessment: Report given to RN and Post -op Vital signs reviewed and stable  Post vital signs: Reviewed and stable  Last Vitals:  Vitals:   12/29/15 1311  BP: (!) 142/79  Pulse: 82  Resp: 20  Temp: 36.7 C    Last Pain:  Vitals:   12/29/15 1311  TempSrc: Oral         Complications: No apparent anesthesia complications

## 2015-12-29 NOTE — H&P (Signed)
  Jonathan MaltaMark C Choi is an 40 y.o. male.   Chief Complaint: left wrist ganglion HPI: 40 yo rhd male with cyst on left wrist for several years.  It is bothersome to him.  He wishes to have it removed.    Allergies:  Allergies  Allergen Reactions  . Cleocin [Clindamycin Hcl] Hives    Past Medical History:  Diagnosis Date  . Abrasion of anterior right lower leg 12/22/2015  . Dental crown present   . Ganglion cyst of wrist, left 12/2015  . High triglycerides     Past Surgical History:  Procedure Laterality Date  . KNEE ARTHROSCOPY Right     Family History: Family History  Problem Relation Age of Onset  . Hypertension Mother   . Cancer Father   . Diabetes Father   . Stroke Maternal Grandfather     Social History:   reports that he has been smoking Cigarettes.  He has a 7.50 pack-year smoking history. He has quit using smokeless tobacco. He reports that he does not drink alcohol or use drugs.  Medications: Medications Prior to Admission  Medication Sig Dispense Refill  . fenofibrate 160 MG tablet Take 1 tablet (160 mg total) by mouth daily. 90 tablet 3  . ibuprofen (ADVIL,MOTRIN) 200 MG tablet Take 200 mg by mouth every 6 (six) hours as needed.    . Multiple Vitamin (MULTIVITAMIN) tablet Take 1 tablet by mouth daily.      No results found for this or any previous visit (from the past 48 hour(s)).  No results found.   A comprehensive review of systems was negative.  Blood pressure (!) 142/79, pulse 82, temperature 98.1 F (36.7 C), temperature source Oral, resp. rate 20, height 5\' 10"  (1.778 m), weight 91.2 kg (201 lb), SpO2 95 %.  General appearance: alert, cooperative and appears stated age Head: Normocephalic, without obvious abnormality, atraumatic Neck: supple, symmetrical, trachea midline Resp: clear to auscultation bilaterally Cardio: regular rate and rhythm GI: non-tender Extremities: Intact sensation and capillary refill all digits.  +epl/fpl/io.  No wounds.   Pulses: 2+ and symmetric Skin: Skin color, texture, turgor normal. No rashes or lesions Neurologic: Grossly normal Incision/Wound:none  Assessment/Plan Left wrist ganglion cyst.  Non operative and operative treatment options were discussed with the patient and patient wishes to proceed with operative treatment. Risks, benefits, and alternatives of surgery were discussed and the patient agrees with the plan of care.   Jonathan Choi R 12/29/2015, 2:21 PM

## 2015-12-29 NOTE — Anesthesia Postprocedure Evaluation (Signed)
Anesthesia Post Note  Patient: Anderson MaltaMark C Monteverde  Procedure(s) Performed: Procedure(s) (LRB): EXCISION MASS, left wrist (Left)  Patient location during evaluation: PACU Anesthesia Type: General Level of consciousness: awake and alert Pain management: pain level controlled Vital Signs Assessment: post-procedure vital signs reviewed and stable Respiratory status: spontaneous breathing, nonlabored ventilation, respiratory function stable and patient connected to nasal cannula oxygen Cardiovascular status: blood pressure returned to baseline and stable Postop Assessment: no signs of nausea or vomiting Anesthetic complications: no    Last Vitals:  Vitals:   12/29/15 1530 12/29/15 1545  BP: 107/79 (!) 130/92  Pulse: 77 80  Resp: 15 15  Temp:      Last Pain:  Vitals:   12/29/15 1311  TempSrc: Oral                 Kennieth RadFitzgerald, Carola Viramontes E

## 2015-12-29 NOTE — Brief Op Note (Signed)
12/29/2015  3:19 PM  PATIENT:  Jonathan Choi  40 y.o. male  PRE-OPERATIVE DIAGNOSIS:  left wrist ganglion  POST-OPERATIVE DIAGNOSIS:  left wrist ganglion  PROCEDURE:  Procedure(s): EXCISION MASS, left wrist (Left)  SURGEON:  Surgeon(s) and Role:    * Betha LoaKevin Kenidee Cregan, MD - Primary  PHYSICIAN ASSISTANT:   ASSISTANTS: none   ANESTHESIA:   general  EBL:  Total I/O In: 1000 [I.V.:1000] Out: 3 [Blood:3]  BLOOD ADMINISTERED:none  DRAINS: none   LOCAL MEDICATIONS USED:  MARCAINE     SPECIMEN:  Source of Specimen:  left wrist  DISPOSITION OF SPECIMEN:  PATHOLOGY  COUNTS:  YES  TOURNIQUET:   Total Tourniquet Time Documented: Upper Arm (Left) - 17 minutes Total: Upper Arm (Left) - 17 minutes   DICTATION: .Other Dictation: Dictation Number no number given  PLAN OF CARE: Discharge to home after PACU  PATIENT DISPOSITION:  PACU - hemodynamically stable.

## 2015-12-29 NOTE — Anesthesia Preprocedure Evaluation (Addendum)
Anesthesia Evaluation  Patient identified by MRN, date of birth, ID band Patient awake    Reviewed: Allergy & Precautions, NPO status , Patient's Chart, lab work & pertinent test results  Airway Mallampati: II  TM Distance: >3 FB Neck ROM: Full    Dental  (+) Dental Advisory Given   Pulmonary Current Smoker,    breath sounds clear to auscultation       Cardiovascular negative cardio ROS   Rhythm:Regular Rate:Normal     Neuro/Psych negative neurological ROS     GI/Hepatic negative GI ROS, Neg liver ROS,   Endo/Other  negative endocrine ROS  Renal/GU negative Renal ROS     Musculoskeletal   Abdominal   Peds  Hematology negative hematology ROS (+)   Anesthesia Other Findings   Reproductive/Obstetrics                             Anesthesia Physical Anesthesia Plan  ASA: II  Anesthesia Plan: General   Post-op Pain Management:    Induction: Intravenous  Airway Management Planned: LMA  Additional Equipment:   Intra-op Plan:   Post-operative Plan: Extubation in OR  Informed Consent: I have reviewed the patients History and Physical, chart, labs and discussed the procedure including the risks, benefits and alternatives for the proposed anesthesia with the patient or authorized representative who has indicated his/her understanding and acceptance.   Dental advisory given  Plan Discussed with: CRNA  Anesthesia Plan Comments:         Anesthesia Quick Evaluation

## 2015-12-29 NOTE — Anesthesia Procedure Notes (Signed)
Procedure Name: LMA Insertion Date/Time: 12/29/2015 2:49 PM Performed by: Genevieve NorlanderLINKA, Lillis Nuttle L Pre-anesthesia Checklist: Patient identified, Emergency Drugs available, Suction available, Patient being monitored and Timeout performed Patient Re-evaluated:Patient Re-evaluated prior to inductionOxygen Delivery Method: Circle system utilized Preoxygenation: Pre-oxygenation with 100% oxygen Intubation Type: IV induction Ventilation: Mask ventilation without difficulty LMA: LMA inserted LMA Size: 5.0 Number of attempts: 1 Airway Equipment and Method: Bite block Placement Confirmation: positive ETCO2 Tube secured with: Tape Dental Injury: Teeth and Oropharynx as per pre-operative assessment

## 2015-12-30 NOTE — Op Note (Signed)
NAMDuffy Rhody:  Davison, Jamai               ACCOUNT NO.:  [id number]  MEDICAL RECORD NO.:  [id number]  LOCATION:                                 FACILITY:  PHYSICIAN:  Betha LoaKevin Markeria Goetsch, MD        DATE OF BIRTH:  1975-05-04  DATE OF PROCEDURE:  12/29/2015 DATE OF DISCHARGE:                              OPERATIVE REPORT   PREOPERATIVE DIAGNOSIS:  left wrist dorsal ganglion.  POSTOPERATIVE DIAGNOSIS:  left wrist dorsal ganglion.  PROCEDURE:  Left wrist dorsal ganglion cyst excision.  SURGEON:  Betha LoaKevin Dayne Chait, MD.  ASSISTANT:  None.  ANESTHESIA:  General.  IV FLUIDS:  Per anesthesia flow sheet.  ESTIMATED BLOOD LOSS:  Minimal.  COMPLICATIONS:  None.  SPECIMENS:  Left wrist mass to Pathology.  TOURNIQUET TIME:  17 minutes.  DISPOSITION:  Stable to PACU.  INDICATIONS:  Mr. Marvetta GibbonsBurleson is a 40 year old right-hand dominant male, who has had a mass in his left wrist.  It is bothersome to him.  He wished to have it removed.  Risks, benefits, and alternatives of surgery were discussed including risk of blood loss; infection; damage to nerves, vessels, tendons, ligaments, bone; failure of surgery; need for additional surgery; complications with wound healing; continued pain; and recurrence of mass.  He voiced understanding of these risks and elected to proceed.  OPERATIVE COURSE:  After being identified preoperatively by myself, the patient and I agreed upon procedure and site of procedure.  Surgical site was marked.  The risks, benefits, and alternatives of surgery were reviewed and he wished to proceed.  Surgical consent had been signed. He was given IV Ancef as preoperative antibiotic prophylaxis.  He was transferred to the operating room and placed on the operating room table in supine position with left upper extremity on armboard.  General anesthesia induced by anesthesiologist.  Left upper extremity was prepped and draped in normal sterile orthopedic fashion.  Surgical pause was  performed between surgeons, anesthesia, and operating staff, and all were in agreement as the patient, procedure, and site of procedure. Tourniquet at the proximal aspect of the extremity was inflated to 250 mmHg after exsanguination of the limb with an Esmarch bandage.  Incision was made on dorsum of the wrist over the mass.  This was carried down to subcutaneous tissues by spreading technique.  Neurovascular structures were protected.  The mass was easily identified.  It was cleared of soft tissue attachments.  The stalk was found coursing to the carpus.  The masses were removed and sent to Pathology for examination.  The rent in the capsule was repaired with a 4-0 Vicryl suture in a figure-of-eight fashion.  The wound was irrigated with sterile saline.  Three interrupted Vicryl sutures were placed in subcutaneous tissues and skin was closed with a running subcuticular 4-0 Monocryl suture.  This was augmented with benzoin and Steri-Strips.  The wound was injected with 7 mL of 0.25% plain Marcaine to aid in postoperative analgesia.  It was then dressed with sterile 4x4s and wrapped with a Kerlix bandage.  A volar splint was placed and wrapped with Kerlix and Ace bandage. Tourniquet was deflated at 17 minutes.  Fingertips were pink with brisk  capillary refill after deflation of tourniquet.  Operative drapes were broken down.  The patient was awoken from anesthesia safely.  He was transferred back to stretcher and taken to PACU in stable condition.  I will see him back in the office in 1 week for postoperative followup. Given Norco 5/325 mg 1-2 p.o. q.6 hours p.r.n. pain, dispensed #20.     Betha Loa, MD   ______________________________ Betha Loa, MD    KK/MEDQ  D:  12/29/2015  T:  12/30/2015  Job:  [id number]

## 2015-12-31 ENCOUNTER — Encounter (HOSPITAL_BASED_OUTPATIENT_CLINIC_OR_DEPARTMENT_OTHER): Payer: Self-pay | Admitting: Orthopedic Surgery

## 2016-01-25 MED FILL — FENOFIBRATE 160 MG TABLET: 160 | 30 days supply | Qty: 30 | Fill #1

## 2016-02-24 MED FILL — FENOFIBRATE 160 MG TABLET: 160 | 30 days supply | Qty: 30 | Fill #2

## 2016-03-10 ENCOUNTER — Encounter: Payer: Self-pay | Admitting: Family Medicine

## 2016-04-01 MED FILL — FENOFIBRATE 160 MG TABLET: 160 | 30 days supply | Qty: 30 | Fill #3

## 2016-04-26 ENCOUNTER — Encounter: Payer: Self-pay | Admitting: Family Medicine

## 2016-04-27 MED ORDER — VALACYCLOVIR HCL 1 G PO TABS: ORAL_TABLET | ORAL | 3 refills | Status: DC

## 2016-04-27 MED FILL — valACYclovir HCL 1 GM TABS: 1 | 4 days supply | Qty: 12 | Fill #0

## 2016-04-27 MED FILL — FENOFIBRATE 160 MG TABLET: 160 | 30 days supply | Qty: 30 | Fill #4

## 2016-06-08 MED FILL — FENOFIBRATE 160 MG TABLET: 160 | 30 days supply | Qty: 30 | Fill #5

## 2016-07-14 MED FILL — FENOFIBRATE 160 MG TABLET: 160 | 30 days supply | Qty: 30 | Fill #6

## 2016-08-22 MED FILL — FENOFIBRATE 160 MG TABLET: 160 | 30 days supply | Qty: 30 | Fill #7

## 2016-08-30 ENCOUNTER — Ambulatory Visit (INDEPENDENT_AMBULATORY_CARE_PROVIDER_SITE_OTHER): Payer: Commercial Managed Care - HMO

## 2016-08-30 ENCOUNTER — Ambulatory Visit (INDEPENDENT_AMBULATORY_CARE_PROVIDER_SITE_OTHER): Payer: Commercial Managed Care - HMO | Admitting: Sports Medicine

## 2016-08-30 ENCOUNTER — Encounter: Payer: Self-pay | Admitting: Sports Medicine

## 2016-08-30 VITALS — BP 120/82 | HR 74 | Ht 70.0 in | Wt 217.4 lb

## 2016-08-30 DIAGNOSIS — S63502A Unspecified sprain of left wrist, initial encounter: Secondary | ICD-10-CM | POA: Diagnosis not present

## 2016-08-30 DIAGNOSIS — M67432 Ganglion, left wrist: Secondary | ICD-10-CM

## 2016-08-30 DIAGNOSIS — M25532 Pain in left wrist: Secondary | ICD-10-CM | POA: Diagnosis not present

## 2016-08-30 NOTE — Assessment & Plan Note (Signed)
Pain is directly over the DRUJ and proximal carpal row.  No focal pain over the scaphoid.  Grip strength is intact.  This does seem more consistent with a wrist sprain and we have discussed in detail if he is having any pain in his radial sided potentially over the scaphoid that we will need to put him in a thumb spica splint.  As long as he remains asymptomatic in the wrist brace he can return to activity as tolerated.

## 2016-08-30 NOTE — Assessment & Plan Note (Signed)
Pain is directly over the prior site of the ganglion cyst.  Suspect possible scar tissue involvement.  Should respond well to conservative measures as outlined above.

## 2016-08-30 NOTE — Progress Notes (Signed)
OFFICE VISIT NOTE Veverly FellsMichael D. Delorise Shinerigby, DO  [facility] [phone]  Anderson MaltaMark C Ganser - 41 y.o. male MRN [id number]  Date of birth: 07/25/1975  Visit Date: 08/30/2016  PCP: Judy Pimpleower, Marne A, MD   Referred by: Tower, Audrie GallusMarne A, MD  Orlie DakinBrandy Shelton, CMA acting as scribe for Dr. Berline Choughigby.  SUBJECTIVE:   Chief Complaint  Patient presents with  . pain in wrist   HPI: As below and per problem based documentation when appropriate.  Pt presents today with complaint of left wrist pain s/p fall 08/27/16. Pain is mostly present on the dorsal aspect of the wrist. He cannot remember how he landed on his wrist when he fell. Pt reports that there is no swelling in the wrist but when he first got up after felling he felt like he couldn't move his wrist and it was numb. No xray of the wrist since injury occurred.   The pain is described as sharp pain and is rated as 8/10 with movement.  Worsened with rotating, bending. He works with his hands a lot at work which causes pain. The injury did not occur at work.  Improves with keeping the wrist relaxed and limiting movement.  Therapies tried include : wearing a wrist brace, taking Advil with some relief. He has not tried icing the wrist or using heat.   Other associated symptoms include: pt reports no associated pain into the hand, fingers or arm.   Pt denies fever, chills night, unintentional weight gain or weight loss.     Review of Systems  Constitutional: Negative for chills and fever.  Respiratory: Negative for shortness of breath and wheezing.   Cardiovascular: Positive for leg swelling (only after beeing on his feet all day). Negative for chest pain and palpitations.  Musculoskeletal: Positive for falls and joint pain.  Neurological: Negative for dizziness, tingling and headaches.  Endo/Heme/Allergies: Does not bruise/bleed easily.    Otherwise per HPI.  HISTORY & PERTINENT PRIOR DATA:  No specialty  comments available. He reports that he has been smoking Cigarettes.  He has a 7.50 pack-year smoking history. He has quit using smokeless tobacco. No results for input(s): HGBA1C, LABURIC in the last 8760 hours. Medications & Allergies reviewed per EMR Patient Active Problem List   Diagnosis Date Noted  . Wrist sprain, left, initial encounter 08/30/2016  . Leukocytosis 12/01/2015  . Ganglion cyst of wrist 12/17/2014  . Low libido 10/20/2014  . Hypertriglyceridemia 10/13/2014  . Screening for HIV (human immunodeficiency virus) 10/13/2014  . Routine general medical examination at a health care facility 10/07/2014  . Smoking 04/14/2014  . Overweight 04/14/2014   Past Medical History:  Diagnosis Date  . Abrasion of anterior right lower leg 12/22/2015  . Dental crown present   . Ganglion cyst of wrist, left 12/2015  . High triglycerides    Family History  Problem Relation Age of Onset  . Hypertension Mother   . Cancer Father   . Diabetes Father   . Stroke Maternal Grandfather    Past Surgical History:  Procedure Laterality Date  . KNEE ARTHROSCOPY Right   . MASS EXCISION Left 12/29/2015   Procedure: EXCISION MASS, left wrist;  Surgeon: Betha LoaKevin Kuzma, MD;  Location: [facility];  Service: Orthopedics;  Laterality: Left;   Social History   Occupational History  . Not on file.   Social History Main Topics  . Smoking status: Current Every Day Smoker    Packs/day: 0.50  Years: 15.00    Types: Cigarettes  . Smokeless tobacco: Former Neurosurgeon  . Alcohol use No  . Drug use: No  . Sexual activity: Not on file    OBJECTIVE:  VS:  HT:5\' 10"  (177.8 cm)   WT:217 lb 6.4 oz (98.6 kg)  BMI:31.3    BP:120/82  HR:74bpm  TEMP: ( )  RESP:94 % EXAM: Findings:  WDWN, NAD, Non-toxic appearing Alert & appropriately interactive Not depressed or anxious appearing No increased work of breathing. Pupils are equal. EOM intact without nystagmus No clubbing or cyanosis of the  extremities appreciated No significant rashes/lesions/ulcerations overlying the examined area. Radial pulses 2+/4.  No significant generalized UE edema. Sensation intact to light touch in upper extremities.  Left wrist: Well aligned.  No significant deformity.  She does have a small amount of swelling over the dorsum of the wrist that is generalized.  He has a small horizontal scar across the DRUJ consistent with a prior ganglion excision.  No focal TTP over the base of the first metacarpal, scaphoid does have a small amount of pain over the ulnar aspect of the dorsal aspect of the DRUJ.  Grip strength is intact.  He has pain with ulnar deviation and radial deviation.  Wrist extension is to 40 wrist flexion is to 35     Dg Wrist Complete Left  Result Date: 08/30/2016 CLINICAL DATA:  Left wrist pain following pulling injury, initial encounter EXAM: LEFT WRIST - COMPLETE 3+ VIEW COMPARISON:  None. FINDINGS: There is no evidence of fracture or dislocation. There is no evidence of arthropathy or other focal bone abnormality. Soft tissues are unremarkable. IMPRESSION: No acute abnormality noted. Electronically Signed   By: Alcide Clever M.D.   On: 08/30/2016 10:05   ASSESSMENT & PLAN:   Problem List Items Addressed This Visit    Ganglion cyst of wrist    Pain is directly over the prior site of the ganglion cyst.  Suspect possible scar tissue involvement.  Should respond well to conservative measures as outlined above.      Wrist sprain, left, initial encounter    Pain is directly over the DRUJ and proximal carpal row.  No focal pain over the scaphoid.  Grip strength is intact.  This does seem more consistent with a wrist sprain and we have discussed in detail if he is having any pain in his radial sided potentially over the scaphoid that we will need to put him in a thumb spica splint.  As long as he remains asymptomatic in the wrist brace he can return to activity as tolerated.       Other Visit  Diagnoses    Left wrist pain    -  Primary   Relevant Orders   DG Wrist Complete Left (Completed)      Follow-up: Return in about 2 weeks (around 09/13/2016) for repeat clinical exam and repeat X-rays if persistently symptomatic.   CMA/ATC served as Neurosurgeon during this visit. History, Physical, and Plan performed by medical provider. Documentation and orders reviewed and attested to.      Gaspar Bidding, DO    [facility]

## 2016-09-13 ENCOUNTER — Ambulatory Visit (INDEPENDENT_AMBULATORY_CARE_PROVIDER_SITE_OTHER): Payer: Commercial Managed Care - HMO | Admitting: Sports Medicine

## 2016-09-13 ENCOUNTER — Encounter: Payer: Self-pay | Admitting: Sports Medicine

## 2016-09-13 VITALS — BP 130/90 | HR 70 | Ht 70.0 in | Wt 217.2 lb

## 2016-09-13 DIAGNOSIS — S63502A Unspecified sprain of left wrist, initial encounter: Secondary | ICD-10-CM | POA: Diagnosis not present

## 2016-09-13 DIAGNOSIS — M25532 Pain in left wrist: Secondary | ICD-10-CM

## 2016-09-13 DIAGNOSIS — M67432 Ganglion, left wrist: Secondary | ICD-10-CM

## 2016-09-13 NOTE — Assessment & Plan Note (Addendum)
Doing well.  Consistent with a wrist sprain.  Okay to wean out of the brace as tolerated.  If any lack of improvement will follow-up with no specific restrictions at this time.

## 2016-09-13 NOTE — Progress Notes (Signed)
OFFICE VISIT NOTE Jonathan Choi. [facility] [phone]  Jonathan Choi - 41 y.o. male MRN [id number]  Date of birth: May 15, 1975  Visit Date: 09/13/2016  PCP: Jonathan Pimple, MD   Referred by: Tower, Jonathan Gallus, MD  [address] cma  acting as scribe for Dr. Berline Chough.  SUBJECTIVE:   Chief Complaint  Patient presents with  . Follow-up  . Left Wrist Pain/Ganglion of LT wrist   HPI: As below and per problem based documentation when appropriate.   Jonathan Choi is a follow up from fall on 08/27/2016. The area is the dorsal aspect of the wrist has improved. The area is 75 percent better. He is wearing wrist brace daily. Xray were done at previous visit. Denies any swelling or redness.     ROS  Otherwise per HPI.  HISTORY & PERTINENT PRIOR DATA:  No specialty comments available. He reports that he has been smoking Cigarettes.  He has a 7.50 pack-year smoking history. He has quit using smokeless tobacco. No results for input(s): HGBA1C, LABURIC in the last 8760 hours. Medications & Allergies reviewed per EMR Patient Active Problem List   Diagnosis Date Noted  . Wrist sprain, left, initial encounter 08/30/2016  . Leukocytosis 12/01/2015  . Ganglion cyst of wrist 12/17/2014  . Low libido 10/20/2014  . Hypertriglyceridemia 10/13/2014  . Screening for HIV (human immunodeficiency virus) 10/13/2014  . Routine general medical examination at a health care facility 10/07/2014  . Smoking 04/14/2014  . Overweight 04/14/2014   Past Medical History:  Diagnosis Date  . Abrasion of anterior right lower leg 12/22/2015  . Dental crown present   . Ganglion cyst of wrist, left 12/2015  . High triglycerides    Family History  Problem Relation Age of Onset  . Hypertension Mother   . Cancer Father   . Diabetes Father   . Stroke Maternal Grandfather    Past Surgical History:  Procedure Laterality Date  . KNEE ARTHROSCOPY Right   . MASS  EXCISION Left 12/29/2015   Procedure: EXCISION MASS, left wrist;  Surgeon: Jonathan Loa, MD;  Location: [facility];  Service: Orthopedics;  Laterality: Left;   Social History   Occupational History  . Not on file.   Social History Main Topics  . Smoking status: Current Every Day Smoker    Packs/day: 0.50    Years: 15.00    Types: Cigarettes  . Smokeless tobacco: Former Jonathan Choi  . Alcohol use No  . Drug use: No  . Sexual activity: Not on file    OBJECTIVE:  VS:  HT:5\' 10"  (177.8 cm)   WT:217 lb 3.2 oz (98.5 kg)  BMI:31.2    BP:130/90  HR:70bpm  TEMP: ( )  RESP:96 % EXAM: Findings:  Wrist is overall well aligned.  No significant deformity.  No focal TTP over the distal radius, DRUJ, distal ulna proximal carpal row.  He has full flexion extension.  Strength 5/5.  Radial pulses 2+/4.  Good capillary refill.  Sensation intact light touch.  Postsurgical incision from prior ganglion is healed.  No significant fluctuance over the ganglion     Dg Wrist Complete Left  Result Date: 08/30/2016 CLINICAL DATA:  Left wrist pain following pulling injury, initial encounter EXAM: LEFT WRIST - COMPLETE 3+ VIEW COMPARISON:  None. FINDINGS: There is no evidence of fracture or dislocation. There is no evidence of arthropathy or other focal bone abnormality. Soft tissues are unremarkable. IMPRESSION: No  acute abnormality noted. Electronically Signed   By: Jonathan CleverMark  Choi M.D.   On: 08/30/2016 10:05   ASSESSMENT & PLAN:   Problem List Items Addressed This Visit    Ganglion cyst of wrist   Wrist sprain, left, initial encounter    Doing well.  His spine.  Okay to wean out of the brace as tolerated.  If any lack of improvement will follow-up with no specific restrictions at this time.       Other Visit Diagnoses    Left wrist pain    -  Primary      Follow-up: Return if symptoms worsen or fail to improve.   CMA/ATC served as Neurosurgeonscribe during this visit. History, Physical, and Plan  performed by medical provider. Documentation and orders reviewed and attested to.      Jonathan BiddingMichael Dereck Agerton, DO    [facility] Physician

## 2016-10-18 ENCOUNTER — Encounter: Payer: Self-pay | Admitting: Family Medicine

## 2016-10-31 ENCOUNTER — Encounter: Payer: Self-pay | Admitting: Family Medicine

## 2016-10-31 DIAGNOSIS — Z0000 Encounter for general adult medical examination without abnormal findings: Secondary | ICD-10-CM

## 2016-10-31 DIAGNOSIS — E781 Pure hyperglyceridemia: Secondary | ICD-10-CM

## 2016-11-02 MED FILL — FENOFIBRATE 160 MG TABLET: 160 | 30 days supply | Qty: 30 | Fill #8

## 2016-11-27 NOTE — Telephone Encounter (Signed)
-----   Message from Alvina Chouerri J Walsh sent at 11/25/2016 11:33 AM EDT ----- Regarding: Lab orders for Wednesday, 9.12.18 Patient is scheduled for CPX labs, please order future labs, Thanks , Camelia Engerri

## 2016-11-30 ENCOUNTER — Other Ambulatory Visit (INDEPENDENT_AMBULATORY_CARE_PROVIDER_SITE_OTHER): Payer: 59

## 2016-11-30 DIAGNOSIS — Z0000 Encounter for general adult medical examination without abnormal findings: Secondary | ICD-10-CM | POA: Diagnosis not present

## 2016-11-30 DIAGNOSIS — E781 Pure hyperglyceridemia: Secondary | ICD-10-CM | POA: Diagnosis not present

## 2016-11-30 LAB — CBC WITH DIFFERENTIAL/PLATELET
BASOS ABS: 0.1 10*3/uL (ref 0.0–0.1)
Basophils Relative: 0.7 % (ref 0.0–3.0)
EOS ABS: 0.2 10*3/uL (ref 0.0–0.7)
Eosinophils Relative: 2 % (ref 0.0–5.0)
HCT: 43.1 % (ref 39.0–52.0)
HEMOGLOBIN: 14.4 g/dL (ref 13.0–17.0)
LYMPHS ABS: 3.5 10*3/uL (ref 0.7–4.0)
Lymphocytes Relative: 29.9 % (ref 12.0–46.0)
MCHC: 33.5 g/dL (ref 30.0–36.0)
MCV: 87.3 fl (ref 78.0–100.0)
Monocytes Absolute: 0.9 10*3/uL (ref 0.1–1.0)
Monocytes Relative: 7.6 % (ref 3.0–12.0)
NEUTROS PCT: 59.8 % (ref 43.0–77.0)
Neutro Abs: 7 10*3/uL (ref 1.4–7.7)
Platelets: 318 10*3/uL (ref 150.0–400.0)
RBC: 4.93 Mil/uL (ref 4.22–5.81)
RDW: 14.3 % (ref 11.5–15.5)
WBC: 11.8 10*3/uL — AB (ref 4.0–10.5)

## 2016-11-30 LAB — COMPREHENSIVE METABOLIC PANEL
ALBUMIN: 4.2 g/dL (ref 3.5–5.2)
ALK PHOS: 92 U/L (ref 39–117)
ALT: 25 U/L (ref 0–53)
AST: 20 U/L (ref 0–37)
BUN: 17 mg/dL (ref 6–23)
CO2: 23 mEq/L (ref 19–32)
CREATININE: 1.02 mg/dL (ref 0.40–1.50)
Calcium: 9.6 mg/dL (ref 8.4–10.5)
Chloride: 109 mEq/L (ref 96–112)
GFR: 85.93 mL/min (ref >60.00)
GLUCOSE: 110 mg/dL — AB (ref 70–99)
Potassium: 4 mEq/L (ref 3.5–5.1)
SODIUM: 140 meq/L (ref 135–145)
TOTAL PROTEIN: 7.1 g/dL (ref 6.0–8.3)
Total Bilirubin: 0.3 mg/dL (ref 0.2–1.2)

## 2016-11-30 LAB — LIPID PANEL
CHOL/HDL RATIO: 5
Cholesterol: 212 mg/dL — ABNORMAL HIGH (ref 0–200)
HDL: 43.6 mg/dL (ref >39.00)
NonHDL: 168.67
Triglycerides: 239 mg/dL — ABNORMAL HIGH (ref 0.0–149.0)
VLDL: 47.8 mg/dL — ABNORMAL HIGH (ref 0.0–40.0)

## 2016-11-30 LAB — LDL CHOLESTEROL, DIRECT: LDL DIRECT: 133 mg/dL

## 2016-11-30 LAB — TSH: TSH: 1.74 u[IU]/mL (ref 0.35–4.50)

## 2016-12-06 ENCOUNTER — Ambulatory Visit (INDEPENDENT_AMBULATORY_CARE_PROVIDER_SITE_OTHER): Payer: 59 | Admitting: Family Medicine

## 2016-12-06 ENCOUNTER — Encounter: Payer: Self-pay | Admitting: Family Medicine

## 2016-12-06 VITALS — BP 128/78 | HR 81 | Temp 98.1°F | Ht 69.5 in | Wt 211.5 lb

## 2016-12-06 DIAGNOSIS — Z0000 Encounter for general adult medical examination without abnormal findings: Secondary | ICD-10-CM

## 2016-12-06 DIAGNOSIS — D72829 Elevated white blood cell count, unspecified: Secondary | ICD-10-CM

## 2016-12-06 DIAGNOSIS — R7303 Prediabetes: Secondary | ICD-10-CM | POA: Insufficient documentation

## 2016-12-06 DIAGNOSIS — R7309 Other abnormal glucose: Secondary | ICD-10-CM | POA: Diagnosis not present

## 2016-12-06 DIAGNOSIS — F17200 Nicotine dependence, unspecified, uncomplicated: Secondary | ICD-10-CM

## 2016-12-06 DIAGNOSIS — E663 Overweight: Secondary | ICD-10-CM | POA: Diagnosis not present

## 2016-12-06 DIAGNOSIS — R454 Irritability and anger: Secondary | ICD-10-CM | POA: Diagnosis not present

## 2016-12-06 DIAGNOSIS — Z72820 Sleep deprivation: Secondary | ICD-10-CM | POA: Diagnosis not present

## 2016-12-06 DIAGNOSIS — Z23 Encounter for immunization: Secondary | ICD-10-CM | POA: Diagnosis not present

## 2016-12-06 DIAGNOSIS — E781 Pure hyperglyceridemia: Secondary | ICD-10-CM | POA: Diagnosis not present

## 2016-12-06 MED ORDER — FENOFIBRATE 160 MG PO TABS: 160.0000 mg | ORAL_TABLET | Freq: Every day | ORAL | 3 refills | Status: DC

## 2016-12-06 MED FILL — FENOFIBRATE 160 MG TABLET: 160 | 30 days supply | Qty: 30 | Fill #0

## 2016-12-06 NOTE — Assessment & Plan Note (Signed)
Likely caused by stressors and severe lack of sleep  No time for self care  Disc lifestyle changes  Sleep is a must Reviewed stressors/ coping techniques/symptoms/ support sources/ tx options and side effects in detail today Consider counseling if open to it  Continue to follow

## 2016-12-06 NOTE — Assessment & Plan Note (Signed)
Disc in detail risks of smoking and possible outcomes including copd, vascular/ heart disease, cancer , respiratory and sinus infections  Pt voices understanding Pt is not ready to quit currently with stressors

## 2016-12-06 NOTE — Assessment & Plan Note (Signed)
110 fasting  disc imp of low glycemic diet and wt loss to prevent DM2

## 2016-12-06 NOTE — Progress Notes (Signed)
Subjective:    Patient ID: Jonathan Choi, male    DOB: 1975-06-18, 41 y.o.   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)  Gets 5-6 hours of sleep per day  Forces himself to go to bed at 10 pm , falls asleep quickly (tends to be a light sleeper and tosses and turns) - then gets up at 3:45 (alarm)  He feels better after 9 hours  When he does not do tree work- gets home at 5:15  When he does tree work - gets home after 9   No time for self care    Had a flu shot today   Wt Readings from Last 3 Encounters:  12/06/16 211 lb 8 oz (95.9 kg)  09/13/16 217 lb 3.2 oz (98.5 kg)  08/30/16 217 lb 6.4 oz (98.6 kg)  continues to work on his weight  Sometimes he also skips meals due to his long work hours  Tries to eat healthy foods when he can - has cut back on fried food/no cheeseburgers More grilled chicken  30.79 kg/m  Had HIV screen 9/16  Tdap 5/14  PNA 23 5/14  Smoking status -still 1/2 ppd -not ready to quit yet due to stress/irritability  No breathing problems   Prostate health-no problems with urination and no nocturia   Family hx - no prostrate cancer /no colon cancer  Father had lung cancer   Hypertriglyceridemia On fenofibrate Lab Results  Component Value Date   CHOL 212 (H) 11/30/2016   CHOL 196 11/30/2015   CHOL 209 (H) 01/16/2015   Lab Results  Component Value Date   HDL 43.60 11/30/2016   HDL 34.50 (L) 11/30/2015   HDL 33.90 (L) 01/16/2015   Lab Results  Component Value Date   LDLCALC 129 (H) 11/30/2015   LDLCALC 137 (H) 01/16/2015   Lab Results  Component Value Date   TRIG 239.0 (H) 11/30/2016   TRIG 163.0 (H) 11/30/2015   TRIG 188.0 (H) 01/16/2015   Lab Results  Component Value Date   CHOLHDL 5 11/30/2016   CHOLHDL 6 11/30/2015   CHOLHDL 6 01/16/2015   Lab Results  Component Value Date   LDLDIRECT 133.0 11/30/2016   LDLDIRECT 147.0 11/27/2014   LDLDIRECT 131.0 10/08/2014   Triglycerides are up a bit  Eating a lot of cereal  LDL is stable HDL is improved   Results for orders placed or performed in visit on 11/30/16  CBC with Differential/Platelet  Result Value Ref Range   WBC 11.8 (H) 4.0 - 10.5 K/uL   RBC 4.93 4.22 - 5.81 Mil/uL   Hemoglobin 14.4 13.0 - 17.0 g/dL   HCT 40.9 81.1 - 91.4 %   MCV 87.3 78.0 - 100.0 fl   MCHC 33.5 30.0 - 36.0 g/dL   RDW 78.2 95.6 - 21.3 %   Platelets 318.0 150.0 - 400.0 K/uL   Neutrophils Relative % 59.8  43.0 - 77.0 %   Lymphocytes Relative 29.9 12.0 - 46.0 %   Monocytes Relative 7.6 3.0 - 12.0 %   Eosinophils Relative 2.0 0.0 - 5.0 %   Basophils Relative 0.7 0.0 - 3.0 %   Neutro Abs 7.0 1.4 - 7.7 K/uL   Lymphs Abs 3.5 0.7 - 4.0 K/uL   Monocytes Absolute 0.9 0.1 - 1.0 K/uL   Eosinophils Absolute 0.2 0.0 - 0.7 K/uL   Basophils Absolute 0.1 0.0 - 0.1 K/uL  Comprehensive metabolic panel  Result Value Ref Range   Sodium 140 135 - 145 mEq/L   Potassium 4.0 3.5 - 5.1 mEq/L   Chloride 109 96 - 112 mEq/L   CO2 23 19 - 32 mEq/L   Glucose, Bld 110 (H) 70 - 99 mg/dL   BUN 17 6 - 23 mg/dL   Creatinine, Ser 1.61 0.40 - 1.50 mg/dL   Total Bilirubin 0.3 0.2 - 1.2 mg/dL   Alkaline Phosphatase 92 39 - 117 U/L   AST 20 0 - 37 U/L   ALT 25 0 - 53 U/L   Total Protein 7.1 6.0 - 8.3 g/dL   Albumin 4.2 3.5 - 5.2 g/dL   Calcium 9.6 8.4 - 09.6 mg/dL   GFR 04.54 >09.81 mL/min  Lipid panel  Result Value Ref Range   Cholesterol 212 (H) 0 - 200 mg/dL   Triglycerides 191.4 (H) 0.0 - 149.0 mg/dL   HDL 78.29 >56.21 mg/dL     VLDL 30.8 (H) 0.0 - 40.0 mg/dL   Total CHOL/HDL Ratio 5    NonHDL 168.67   TSH  Result Value Ref Range   TSH 1.74 0.35 - 4.50 uIU/mL  LDL cholesterol, direct  Result Value Ref Range   Direct LDL 133.0 mg/dL      Patient Active Problem List   Diagnosis Date Noted  . Lack of adequate sleep 12/06/2016  . Irritability 12/06/2016  . Elevated glucose 12/06/2016  . Leukocytosis 12/01/2015  . Low libido 10/20/2014  . Hypertriglyceridemia 10/13/2014  . Screening for HIV (human immunodeficiency virus) 10/13/2014  . Routine general medical examination at a health care facility 10/07/2014  . Smoking 04/14/2014  . Overweight 04/14/2014   Past Medical History:  Diagnosis Date  . Abrasion of anterior right lower leg 12/22/2015  . Dental crown present   . Ganglion cyst of wrist, left 12/2015  . High triglycerides    Past Surgical History:  Procedure Laterality Date  . KNEE ARTHROSCOPY Right   . MASS EXCISION Left 12/29/2015   Procedure: EXCISION MASS, left wrist;  Surgeon: Betha Loa, MD;  Location: [facility];  Service: Orthopedics;  Laterality: Left;   Social History  Substance Use Topics  . Smoking status: Current Every Day Smoker    Packs/day: 0.50    Years: 15.00    Types: Cigarettes  . Smokeless tobacco: Former Neurosurgeon  . Alcohol use No   Family History  Problem Relation Age of Onset  . Hypertension Mother   . Cancer Father   . Diabetes Father   . Stroke Maternal Grandfather    Allergies  Allergen Reactions  . Cleocin [Clindamycin Hcl] Hives   Current Outpatient Prescriptions on File Prior to Visit  Medication Sig Dispense Refill  . ibuprofen (ADVIL,MOTRIN) 200 MG tablet Take 200 mg by mouth every 6 (six) hours as needed.    . Multiple Vitamin (MULTIVITAMIN) tablet Take 1 tablet by mouth daily.     No current facility-administered medications on file prior to  visit.     Review of Systems  Constitutional: Positive for fatigue. Negative for  activity change, appetite change, fever and unexpected weight change.  HENT: Negative for congestion, rhinorrhea, sore throat and trouble swallowing.   Eyes: Negative for pain, redness, itching and visual disturbance.  Respiratory: Negative for cough, chest tightness, shortness of breath and wheezing.   Cardiovascular: Negative for chest pain and palpitations.  Gastrointestinal: Negative for abdominal pain, blood in stool, constipation, diarrhea and nausea.  Endocrine: Negative for cold intolerance, heat intolerance, polydipsia and polyuria.  Genitourinary: Negative for difficulty urinating, dysuria, frequency and urgency.  Musculoskeletal: Negative for arthralgias, joint swelling and myalgias.  Skin: Negative for pallor and rash.  Neurological: Negative for dizziness, tremors, weakness, numbness and headaches.  Hematological: Negative for adenopathy. Does not bruise/bleed easily.  Psychiatric/Behavioral: Positive for decreased concentration and sleep disturbance. Negative for dysphoric mood and suicidal ideas. The patient is nervous/anxious.        Pos for irritability       Objective:   Physical Exam  Constitutional: He appears well-developed and well-nourished. No distress.  overwt and well app  HENT:  Head: Normocephalic and atraumatic.  Right Ear: External ear normal.  Left Ear: External ear normal.  Nose: Nose normal.  Mouth/Throat: Oropharynx is clear and moist.  Eyes: Pupils are equal, round, and reactive to light. Conjunctivae and EOM are normal. Right eye exhibits no discharge. Left eye exhibits no discharge. No scleral icterus.  Neck: Normal range of motion. Neck supple. No JVD present. Carotid bruit is not present. No thyromegaly present.  Cardiovascular: Normal rate, regular rhythm, normal heart sounds and intact distal pulses.  Exam reveals no gallop.   Pulmonary/Chest: Effort normal and breath sounds normal. No respiratory distress. He has no wheezes. He exhibits no  tenderness.  Good air exch with no wheezing   Abdominal: Soft. Bowel sounds are normal. He exhibits no distension, no abdominal bruit and no mass. There is no tenderness.  Musculoskeletal: He exhibits no edema or tenderness.  Lymphadenopathy:    He has no cervical adenopathy.  Neurological: He is alert. He has normal reflexes. No cranial nerve deficit. He exhibits normal muscle tone. Coordination normal.  Skin: Skin is warm and dry. No rash noted. No erythema. No pallor.  Solar lentigines diffusely  Some scratches on legs     Psychiatric: He has a normal mood and affect.  Not seemingly depressed or anxious today but seems fatigued           Assessment & Plan:   Problem List Items Addressed This Visit      Other   Elevated glucose    110 fasting  disc imp of low glycemic diet and wt loss to prevent DM2       Hypertriglyceridemia    Disc goals for lipids and reasons to control them Rev labs with pt Rev low sat fat diet in detail Fairly stable with fenofibrate        Relevant Medications   fenofibrate 160 MG tablet   Irritability    Likely caused by stressors and severe lack of sleep  No time for self care  Disc lifestyle changes  Sleep is a must Reviewed stressors/ coping techniques/symptoms/ support sources/ tx options and side effects in detail today Consider counseling if open to it  Continue to follow      Lack of adequate sleep    Causing mood change/irritablity  Disc  ways to get more sleep - also sleep hygiene  Needs time for self care Regular exercise  Also not to work both jobs in one day- but split it up        Leukocytosis    Mild and stable - likely reactive  Improved this check  No s/s of infection Is chronically stressed and sleep deprived  Continue to follow       Overweight    Discussed how this problem influences overall health and the risks it imposes  Reviewed plan for weight loss with lower calorie diet (via better food choices  and also portion control or program like weight watchers) and exercise building up to or more than 30 minutes 5 days per week including some aerobic activity   Pt has limited time for self care      Routine general medical examination at a health care facility    Reviewed health habits including diet and exercise and skin cancer prevention Reviewed appropriate screening tests for age  Also reviewed health mt list, fam hx and immunization status , as well as social and family history   See HPI Labs reviewed  Smoking cessation discussed-not ready  Flu shot today  Disc DM avoidance with diet and exercise  Disc need for more sleep and time for self care      Smoking    Disc in detail risks of smoking and possible outcomes including copd, vascular/ heart disease, cancer , respiratory and sinus infections  Pt voices understanding Pt is not ready to quit currently with stressors         Other Visit Diagnoses    Need for influenza vaccination    -  Primary   Relevant Orders   Flu Vaccine QUAD 6+ mos PF IM (Fluarix Quad PF) (Completed)

## 2016-12-06 NOTE — Patient Instructions (Addendum)
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

## 2016-12-06 NOTE — Assessment & Plan Note (Signed)
Causing mood change/irritablity  Disc  ways to get more sleep - also sleep hygiene   Needs time for self care Regular exercise  Also not to work both jobs in one day- but split it up

## 2016-12-06 NOTE — Assessment & Plan Note (Signed)
Disc goals for lipids and reasons to control them Rev labs with pt Rev low sat fat diet in detail Fairly stable with fenofibrate

## 2016-12-06 NOTE — Assessment & Plan Note (Signed)
Mild and stable - likely reactive  Improved this check  No s/s of infection Is chronically stressed and sleep deprived  Continue to follow

## 2016-12-06 NOTE — Assessment & Plan Note (Signed)
Discussed how this problem influences overall health and the risks it imposes  Reviewed plan for weight loss with lower calorie diet (via better food choices and also portion control or program like weight watchers) and exercise building up to or more than 30 minutes 5 days per week including some aerobic activity   Pt has limited time for self care

## 2016-12-06 NOTE — Assessment & Plan Note (Signed)
Reviewed health habits including diet and exercise and skin cancer prevention Reviewed appropriate screening tests for age  Also reviewed health mt list, fam hx and immunization status , as well as social and family history   See HPI Labs reviewed  Smoking cessation discussed-not ready  Flu shot today  Disc DM avoidance with diet and exercise  Disc need for more sleep and time for self care

## 2016-12-15 ENCOUNTER — Encounter: Payer: Self-pay | Admitting: Family Medicine

## 2017-01-23 MED FILL — FENOFIBRATE 160 MG TABLET: 160 | 30 days supply | Qty: 30 | Fill #1

## 2017-03-09 ENCOUNTER — Encounter: Payer: Self-pay | Admitting: Family Medicine

## 2017-03-29 ENCOUNTER — Encounter: Payer: Self-pay | Admitting: Family Medicine

## 2017-03-31 ENCOUNTER — Encounter: Payer: Self-pay | Admitting: Family Medicine

## 2017-03-31 ENCOUNTER — Ambulatory Visit (INDEPENDENT_AMBULATORY_CARE_PROVIDER_SITE_OTHER): Payer: BLUE CROSS/BLUE SHIELD | Admitting: Family Medicine

## 2017-03-31 VITALS — BP 122/92 | HR 66 | Temp 98.1°F | Ht 69.5 in | Wt 215.5 lb

## 2017-03-31 DIAGNOSIS — S39012A Strain of muscle, fascia and tendon of lower back, initial encounter: Secondary | ICD-10-CM | POA: Diagnosis not present

## 2017-03-31 MED ORDER — MELOXICAM 15 MG PO TABS: 15.0000 mg | ORAL_TABLET | Freq: Every day | ORAL | 1 refills | Status: DC | PRN

## 2017-03-31 MED ORDER — METHOCARBAMOL 500 MG PO TABS: 500.0000 mg | ORAL_TABLET | Freq: Three times a day (TID) | ORAL | 1 refills | Status: DC | PRN

## 2017-03-31 MED FILL — METHOCARBAMOL 500 MG TABS: 500 | 10 days supply | Qty: 30 | Fill #0

## 2017-03-31 MED FILL — MELOXICAM 15 MG TABLET: 15 | 30 days supply | Qty: 30 | Fill #0

## 2017-03-31 NOTE — Progress Notes (Signed)
Subjective:    Patient ID: Jonathan Choi, male    DOB: [birth year], 42 y.o.   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 Low back strain 03/31/2017  . Lack of adequate sleep 12/06/2016  . Irritability 12/06/2016  . Elevated glucose 12/06/2016  . Leukocytosis 12/01/2015  . Low libido 10/20/2014  . Hypertriglyceridemia 10/13/2014  . Screening for HIV (human immunodeficiency virus) 10/13/2014  . Routine general medical examination at a health care facility 10/07/2014  . Smoking 04/14/2014  . Overweight 04/14/2014   Past Medical History:  Diagnosis Date  . Abrasion of anterior right lower leg 12/22/2015  . Dental crown present   . Ganglion cyst of wrist, left 12/2015  . High triglycerides    Past Surgical History:  Procedure Laterality Date  . KNEE ARTHROSCOPY Right   . MASS EXCISION Left 12/29/2015   Procedure: EXCISION MASS, left wrist;  Surgeon: Betha Loa, MD;  Location: [facility];  Service: Orthopedics;  Laterality: Left;   Social History   Tobacco Use  . Smoking status: Current Every Day Smoker    Packs/day: 0.50   Years: 15.00    Pack years: 7.50    Types: Cigarettes  . Smokeless tobacco: Former Engineer, water Use Topics  . Alcohol use: No    Alcohol/week: 0.0 oz  . Drug use: No   Family History  Problem Relation Age of Onset  . Hypertension Mother   . Cancer Father   . Diabetes Father   . Stroke Maternal Grandfather    Allergies  Allergen Reactions  . Cleocin [Clindamycin Hcl] Hives   Current Outpatient Medications on File Prior to Visit  Medication Sig Dispense Refill  . fenofibrate 160 MG tablet Take 1 tablet (160 mg total) by mouth daily. 90 tablet 3  . Multiple Vitamin (MULTIVITAMIN) tablet Take 1 tablet by mouth daily.     No current facility-administered medications on file prior to visit.     Review of Systems  Constitutional: Negative for activity change, appetite change, fatigue, fever and unexpected weight change.  HENT: Negative for congestion, rhinorrhea, sore throat and trouble swallowing.   Eyes: Negative for pain, redness, itching and visual disturbance.  Respiratory: Negative for cough, chest tightness, shortness of breath and wheezing.   Cardiovascular: Negative for chest pain and palpitations.  Gastrointestinal: Negative for abdominal  pain, blood in stool, constipation, diarrhea and nausea.  Endocrine: Negative for cold intolerance, heat intolerance, polydipsia and polyuria.  Genitourinary: Negative for difficulty urinating, dysuria, frequency and urgency.  Musculoskeletal: Positive for back pain. Negative for arthralgias, gait problem, joint swelling and myalgias.  Skin: Negative for pallor and rash.  Neurological: Negative for dizziness, tremors, weakness, numbness and headaches.  Hematological: Negative for adenopathy. Does not bruise/bleed easily.  Psychiatric/Behavioral: Negative for decreased concentration and dysphoric mood. The patient is not nervous/anxious.        Objective:   Physical Exam  Constitutional: He appears well-developed and  well-nourished. No distress.  HENT:  Head: Normocephalic and atraumatic.  Eyes: Conjunctivae and EOM are normal. Pupils are equal, round, and reactive to light. No scleral icterus.  Neck: Normal range of motion. Neck supple.  Cardiovascular: Normal rate and regular rhythm.  Pulmonary/Chest: Effort normal and breath sounds normal. He has no wheezes. He has no rales.  Abdominal: Soft. Bowel sounds are normal. He exhibits no distension. There is no tenderness.  Musculoskeletal: He exhibits tenderness.       Lumbar back: He exhibits decreased range of motion, tenderness and spasm. He exhibits no bony tenderness, no edema, no deformity and no laceration.  Tender over L4-5 as well as R lumbar musculature  Neg slr  Nl rom of hips No neuro changes  Flex- full Extend 10 deg with pain  Gait is nl   Lymphadenopathy:    He has no cervical adenopathy.  Neurological: He is alert. He has normal strength and normal reflexes. He displays no atrophy. No cranial nerve deficit or sensory deficit. He exhibits normal muscle tone. Coordination normal.  Negative SLR  Skin: Skin is warm and dry. No rash noted. No erythema. No pallor.  Psychiatric: He has a normal mood and affect.          Assessment & Plan:   Problem List Items Addressed This Visit      Musculoskeletal and Integument   Low back strain    R lumbar area- w/o neuro s/s  Suspect spasm  Px meloxicam and robaxin (with caution of sedation)  Recommend heat/stretching/ handout given for rehab  Avoid heavy lifting  Disc symptomatic care - see instructions on AVS  Update if not starting to improve in a week or if worsening  -would consider films and PT

## 2017-03-31 NOTE — Patient Instructions (Addendum)
Use heat on and off for 10 minutes  Do some stretching  Keep walking  meloxicam 15 mg once daily with food (instead of aleve)  For spasm- try the robaxin (muscle relaxer) with caution of sedation     Update if not starting to improve in a week or if worsening

## 2017-04-02 NOTE — Assessment & Plan Note (Signed)
R lumbar area- w/o neuro s/s  Suspect spasm  Px meloxicam and robaxin (with caution of sedation)  Recommend heat/stretching/ handout given for rehab  Avoid heavy lifting  Disc symptomatic care - see instructions on AVS  Update if not starting to improve in a week or if worsening  -would consider films and PT

## 2017-04-06 MED FILL — FENOFIBRATE 160 MG TABLET: 160 | 30 days supply | Qty: 30 | Fill #2

## 2017-04-11 ENCOUNTER — Ambulatory Visit: Payer: BLUE CROSS/BLUE SHIELD | Admitting: Internal Medicine

## 2017-04-11 ENCOUNTER — Encounter: Payer: Self-pay | Admitting: Internal Medicine

## 2017-04-11 VITALS — BP 124/82 | HR 70 | Temp 97.9°F | Wt 217.0 lb

## 2017-04-11 DIAGNOSIS — R1013 Epigastric pain: Secondary | ICD-10-CM | POA: Diagnosis not present

## 2017-04-11 NOTE — Progress Notes (Signed)
Subjective:    Patient ID: Jonathan Choi, male    DOB: June 09, 1975, 42 y.o.   MRN: [id number]  HPI  Pt presents to the clinic today with c/o epigastric pain. He reports this started 4-5 days ago. It is intermittent. The pain radiates down around the belly button. The pain has woken him up at night. He denies nausea, vomiting, diarrhea or constipation. He denies blood in his stool. He has tried Probiotic and Tums with some relief. He denies recent changes in diet. He was put on Meloxicam 11 days ago for a lumbar strain. He denies recent travel.  Review of Systems  Past Medical History:  Diagnosis Date  . Abrasion of anterior right lower leg 12/22/2015  . Dental crown present   . Ganglion cyst of wrist, left 12/2015  . High triglycerides     Current Outpatient Medications  Medication Sig Dispense Refill  . fenofibrate 160 MG tablet Take 1 tablet (160 mg total) by mouth daily. 90 tablet 3  . meloxicam (MOBIC) 15 MG tablet Take 1 tablet (15 mg total) by mouth daily as needed for pain. Take with a meal 30 tablet 1  . methocarbamol (ROBAXIN) 500 MG tablet Take 1 tablet (500 mg total) by mouth 3 (three) times daily as needed for muscle spasms (back pain). 30 tablet 1  . Multiple Vitamin (MULTIVITAMIN) tablet Take 1 tablet by mouth daily.    . Probiotic Product (PROBIOTIC DAILY) CAPS Take 1 capsule by mouth daily.     No current facility-administered medications for this visit.     Allergies  Allergen Reactions  . Cleocin [Clindamycin Hcl] Hives    Family History  Problem Relation Age of Onset  . Hypertension Mother   . Cancer Father   . Diabetes Father   . Stroke Maternal Grandfather     Social History   Socioeconomic History  . Marital status: Married    Spouse name: Not on file  . Number of children: Not on file  . Years of education: Not on file  . Highest education level: Not on file  Social Needs  . Financial resource strain: Not on file  . Food insecurity - worry:  Not on file  . Food insecurity - inability: Not on file  . Transportation needs - medical: Not on file  . Transportation needs - non-medical: Not on file  Occupational History  . Not on file  Tobacco Use  . Smoking status: Current Every Day Smoker    Packs/day: 0.50    Years: 15.00    Pack years: 7.50    Types: Cigarettes  . Smokeless tobacco: Former Engineer, water and Sexual Activity  . Alcohol use: No    Alcohol/week: 0.0 oz  . Drug use: No  . Sexual activity: Not on file  Other Topics Concern  . Not on file  Social History Narrative   Married    3 children    Works in Control and instrumentation engineer       Former heavy smoker - quit in 2009     Constitutional: Denies fever, malaise, fatigue, headache or abrupt weight changes.  Gastrointestinal: Pt reports abdominal pain. Denies bloating, constipation, diarrhea or blood in the stool.  GU: Denies urgency, frequency, pain with urination, burning sensation, blood in urine, odor or discharge.  No other specific complaints in a complete review of systems (except as listed in HPI above).     Objective:   Physical Exam  BP 124/82   Pulse 70  Temp 97.9 F (36.6 C) (Oral)   Wt 217 lb (98.4 kg)   SpO2 98%   BMI 31.59 kg/m  Wt Readings from Last 3 Encounters:  04/11/17 217 lb (98.4 kg)  03/31/17 215 lb 8 oz (97.8 kg)  12/06/16 211 lb 8 oz (95.9 kg)    General: Appears his stated age, in NAD. Cardiovascular: Normal rate and rhythm.  Pulmonary/Chest: Normal effort and positive vesicular breath sounds. No respiratory distress. No wheezes, rales or ronchi noted.  Abdomen: Soft and tender in the epigastric region. Normal bowel sounds. No distention or masses noted.    BMET    Component Value Date/Time   NA 140 11/30/2016 0759   K 4.0 11/30/2016 0759   CL 109 11/30/2016 0759   CO2 23 11/30/2016 0759   GLUCOSE 110 (H) 11/30/2016 0759   BUN 17 11/30/2016 0759   CREATININE 1.02 11/30/2016 0759   CALCIUM 9.6 11/30/2016 0759      Lipid Panel     Component Value Date/Time   CHOL 212 (H) 11/30/2016 0759   TRIG 239.0 (H) 11/30/2016 0759   HDL 43.60 11/30/2016 0759   CHOLHDL 5 11/30/2016 0759   VLDL 47.8 (H) 11/30/2016 0759   LDLCALC 129 (H) 11/30/2015 1104    CBC    Component Value Date/Time   WBC 11.8 (H) 11/30/2016 0759   RBC 4.93 11/30/2016 0759   HGB 14.4 11/30/2016 0759   HCT 43.1 11/30/2016 0759   PLT 318.0 11/30/2016 0759   MCV 87.3 11/30/2016 0759   MCHC 33.5 11/30/2016 0759   RDW 14.3 11/30/2016 0759   LYMPHSABS 3.5 11/30/2016 0759   MONOABS 0.9 11/30/2016 0759   EOSABS 0.2 11/30/2016 0759   BASOSABS 0.1 11/30/2016 0759    Hgb A1C No results found for: HGBA1C          Assessment & Plan:   Epigastric Pain:  Concerning for Gastritis/GERD Stop Meloxicam Start Zantac 150 mg PO QHS Handout given on diet for GERD  Return precautions discussed Nicki ReaperBAITY, Torryn Hudspeth, NP

## 2017-04-11 NOTE — Patient Instructions (Signed)
Gastritis, Adult  Gastritis is swelling (inflammation) of the stomach. When you have this condition, you can have these problems (symptoms):  ? Pain in your stomach.  ? A burning feeling in your stomach.  ? Feeling sick to your stomach (nauseous).  ? Throwing up (vomiting).  ? Feeling too full after you eat.  It is important to get help for this condition. Without help, your stomach can bleed, and you can get sores (ulcers) in your stomach.  Follow these instructions at home:  ? Take over-the-counter and prescription medicines only as told by your doctor.  ? If you were prescribed an antibiotic medicine, take it as told by your doctor. Do not stop taking it even if you start to feel better.  ? Drink enough fluid to keep your pee (urine) clear or pale yellow.  ? Instead of eating big meals, eat small meals often.  Contact a health care provider if:  ? Your problems get worse.  ? Your problems go away and then come back.  Get help right away if:  ? You throw up blood or something that looks like coffee grounds.  ? You have black or dark red poop (stools).  ? You cannot keep fluids down.  ? Your stomach pain gets worse.  ? You have a fever.  ? You do not feel better after 1 week.  This information is not intended to replace advice given to you by your health care provider. Make sure you discuss any questions you have with your health care provider.  Document Released: 08/24/2007 Document Revised: 11/04/2015 Document Reviewed: 11/29/2014  Elsevier Interactive Patient Education ? 2018 [employer] Inc[employer]

## 2017-04-19 MED FILL — valACYclovir HCL 1 GM TABS: 1 | 4 days supply | Qty: 12 | Fill #1

## 2017-05-02 ENCOUNTER — Encounter: Payer: Self-pay | Admitting: Family Medicine

## 2017-05-12 DIAGNOSIS — H40033 Anatomical narrow angle, bilateral: Secondary | ICD-10-CM | POA: Diagnosis not present

## 2017-05-12 DIAGNOSIS — H04123 Dry eye syndrome of bilateral lacrimal glands: Secondary | ICD-10-CM | POA: Diagnosis not present

## 2017-05-15 MED FILL — FENOFIBRATE 160 MG TABLET: 160 | 30 days supply | Qty: 30 | Fill #3 | Status: TO

## 2017-06-29 ENCOUNTER — Encounter: Payer: Self-pay | Admitting: Family Medicine

## 2017-06-29 ENCOUNTER — Ambulatory Visit: Payer: BLUE CROSS/BLUE SHIELD | Admitting: Family Medicine

## 2017-06-29 DIAGNOSIS — M79642 Pain in left hand: Secondary | ICD-10-CM | POA: Diagnosis not present

## 2017-06-29 NOTE — Progress Notes (Signed)
Subjective:    Patient ID: Jonathan Choi, male    DOB: 03/22/1975, 42 y.o.   MRN: [id number]  HPI Here for L wrist pain   Had a cyst on it  Then had surgery (that was 2 y ago) He noted the cyst return about 8 mo later (small-not bothersome)  Now it is really bothering him again  Medial hand and wrist hurt (dorsal)  A lot of pain when he bends his wrist   Tingly at times (the 4/5th fingers and top of hand)   Never had carpal tunnel   Takes some aleve - just helps a bit (not taking meloxicam)   Tried ice  Has not tried heat    Wt Readings from Last 3 Encounters:  06/29/17 220 lb (99.8 kg)  04/11/17 217 lb (98.4 kg)  03/31/17 215 lb 8 oz (97.8 kg)   32.02 kg/m   Patient Active Problem List   Diagnosis Date Noted  . Hand pain, left 06/29/2017  . Low back strain 03/31/2017  . Lack of adequate sleep 12/06/2016  . Irritability 12/06/2016  . Elevated glucose 12/06/2016  . Leukocytosis 12/01/2015  . Low libido 10/20/2014  . Hypertriglyceridemia 10/13/2014  . Screening for HIV (human immunodeficiency virus) 10/13/2014  . Routine general medical examination at a health care facility 10/07/2014  . Smoking 04/14/2014  . Overweight 04/14/2014   Past Medical History:  Diagnosis Date  . Abrasion of anterior right lower leg 12/22/2015  . Dental crown present   . Ganglion cyst of wrist, left 12/2015  . High triglycerides    Past Surgical History:  Procedure Laterality Date  . KNEE ARTHROSCOPY Right   . MASS EXCISION Left 12/29/2015   Procedure: EXCISION MASS, left wrist;  Surgeon: Betha LoaKevin Kuzma, MD;  Location: [facility];  Service: Orthopedics;  Laterality: Left;   Social History   Tobacco Use  . Smoking status: Current Every Day Smoker    Packs/day: 0.50    Years: 15.00    Pack years: 7.50    Types: Cigarettes  . Smokeless tobacco: Former Engineer, waterUser  Substance Use Topics  . Alcohol use: No    Alcohol/week: 0.0 oz  . Drug use: No   Family History    Problem Relation Age of Onset  . Hypertension Mother   . Cancer Father   . Diabetes Father   . Stroke Maternal Grandfather    Allergies  Allergen Reactions  . Cleocin [Clindamycin Hcl] Hives   Current Outpatient Medications on File Prior to Visit  Medication Sig Dispense Refill  . fenofibrate 160 MG tablet Take 1 tablet (160 mg total) by mouth daily. 90 tablet 3  . meloxicam (MOBIC) 15 MG tablet Take 1 tablet (15 mg total) by mouth daily as needed for pain. Take with a meal 30 tablet 1  . methocarbamol (ROBAXIN) 500 MG tablet Take 1 tablet (500 mg total) by mouth 3 (three) times daily as needed for muscle spasms (back pain). 30 tablet 1  . Multiple Vitamin (MULTIVITAMIN) tablet Take 1 tablet by mouth daily.    . Probiotic Product (PROBIOTIC DAILY) CAPS Take 1 capsule by mouth daily.     No current facility-administered medications on file prior to visit.     Review of Systems  Constitutional: Negative for activity change, appetite change, fatigue, fever and unexpected weight change.  HENT: Negative for congestion, rhinorrhea, sore throat and trouble swallowing.   Eyes: Negative for pain, redness, itching and visual disturbance.  Respiratory: Negative for  cough, chest tightness, shortness of breath and wheezing.   Cardiovascular: Negative for chest pain and palpitations.  Gastrointestinal: Negative for abdominal pain, blood in stool, constipation, diarrhea and nausea.  Endocrine: Negative for cold intolerance, heat intolerance, polydipsia and polyuria.  Genitourinary: Negative for difficulty urinating, dysuria, frequency and urgency.  Musculoskeletal: Negative for arthralgias, joint swelling and myalgias.       L hand and wrist pain   Skin: Negative for pallor and rash.  Neurological: Negative for dizziness, tremors, weakness, numbness and headaches.       Tingling in 4,5th fingers of L hand w/o frank numbness  Hematological: Negative for adenopathy. Does not bruise/bleed easily.   Psychiatric/Behavioral: Negative for decreased concentration and dysphoric mood. The patient is not nervous/anxious.        Objective:   Physical Exam  Constitutional: He appears well-developed and well-nourished. No distress.  overwt and well app  HENT:  Head: Normocephalic and atraumatic.  Neck: Normal range of motion. Neck supple.  Musculoskeletal: He exhibits tenderness. He exhibits no edema or deformity.  L hand Dorsal wrist scar from prev surgery-some fullness Nl perf/sens  Tender over dorsal wrist and 4,5th metacarpals proximally  Worse pain to fully extend and flex wrist  Nl pronation/supination  No crepitus or deformity   Some tingling noted with tinel test   Lymphadenopathy:    He has no cervical adenopathy.  Neurological: He is alert. He has normal reflexes. He displays no atrophy. No sensory deficit. He exhibits normal muscle tone.  L hand- some 4,5th finger tingling with tinel test   Nl grip and hand strength bilat   Skin: Skin is warm and dry. No rash noted. No erythema.  Tanned No rashes Tattoos on both arms /hands  Psychiatric: He has a normal mood and affect.          Assessment & Plan:   Problem List Items Addressed This Visit      Other   Hand pain, left    Pt had surgery for dorsal hand/wrist cyst several years ago and is having more pain again  Some limitation in flex/ext and he thinks cyst has returned  Also some tingling - ? If related Not his dominant hand Will refer back to hand surgeon for re eval   rec change back to meloxicam for nsaid  Cold compress Wrist splint (had with him today) for support and compression      Relevant Orders   Ambulatory referral to Orthopedic Surgery

## 2017-06-29 NOTE — Assessment & Plan Note (Addendum)
Pt had surgery for dorsal hand/wrist cyst several years ago and is having more pain again  Some limitation in flex/ext and he thinks cyst has returned  Also some tingling - ? If related Not his dominant hand Will refer back to hand surgeon for re eval   rec change back to meloxicam for nsaid  Cold compress Wrist splint (had with him today) for support and compression

## 2017-06-29 NOTE — Patient Instructions (Signed)
Instead of aleve-switch to meloxicam once daily with food-it may work better Use cold compress when able - 10 minutes at a time Keep wearing the wrist splint when you can for compression and support   We will refer you to the hand specialist

## 2017-07-05 DIAGNOSIS — M67432 Ganglion, left wrist: Secondary | ICD-10-CM | POA: Diagnosis not present

## 2017-07-05 MED FILL — predniSONE 5 MG TABS: 5 | 6 days supply | Qty: 21 | Fill #0

## 2017-07-05 MED FILL — MELOXICAM 15 MG TABLET: 15 | 30 days supply | Qty: 30 | Fill #1

## 2017-07-11 ENCOUNTER — Encounter: Payer: Self-pay | Admitting: Family Medicine

## 2017-07-11 DIAGNOSIS — K625 Hemorrhage of anus and rectum: Secondary | ICD-10-CM

## 2017-07-12 DIAGNOSIS — K625 Hemorrhage of anus and rectum: Secondary | ICD-10-CM | POA: Insufficient documentation

## 2017-07-12 NOTE — Telephone Encounter (Signed)
Referral to GI for new rectal bleeding See the mychart message [facility]

## 2017-07-13 ENCOUNTER — Encounter: Payer: Self-pay | Admitting: Gastroenterology

## 2017-07-17 ENCOUNTER — Ambulatory Visit: Payer: BLUE CROSS/BLUE SHIELD | Admitting: Gastroenterology

## 2017-07-17 ENCOUNTER — Encounter: Payer: Self-pay | Admitting: Gastroenterology

## 2017-07-17 VITALS — BP 118/80 | HR 72 | Ht 69.0 in | Wt 219.0 lb

## 2017-07-17 DIAGNOSIS — K625 Hemorrhage of anus and rectum: Secondary | ICD-10-CM | POA: Diagnosis not present

## 2017-07-17 MED ORDER — PEG 3350-KCL-NA BICARB-NACL 420 G PO SOLR: 4000.0000 mL | ORAL | 0 refills | Status: DC

## 2017-07-17 NOTE — Patient Instructions (Addendum)
You will be set up for a colonoscopy for rectal bleeding.  Normal BMI (Body Mass Index- based on height and weight) is between 19 and 25. Your BMI today is Body mass index is 32.34 kg/m. Marland Kitchen Please consider follow up  regarding your BMI with your Primary Care Provider.

## 2017-07-17 NOTE — Progress Notes (Signed)
HPI: This is a very pleasant 42 year old man who was referred to me by Jonathan Pimple, MD  to evaluate rectal bleeding.    Chief complaint is rectal bleeding  About a week ago he noticed  red rectal bleeding, blood on TP.  Dripping blood into the toilet.  The following day two more episodes.     these episodes were after bowel movements.   no associated constipation or anal discomfort with the bleeding.  Father had colon polyps.  No colon cancer in his family.  Overall his weight has been stable.  He has had no significant abdominal pains Rectal examination with male Old Data Reviewed: Labs [phone] not anemic    Review of systems: Pertinent positive and negative review of systems were noted in the above HPI section. All other review negative.   Past Medical History:  Diagnosis Date  . Abrasion of anterior right lower leg 12/22/2015  . Dental crown present   . Ganglion cyst of wrist, left 12/2015  . High triglycerides     Past Surgical History:  Procedure Laterality Date  . KNEE ARTHROSCOPY Right   . MASS EXCISION Left 12/29/2015   Procedure: EXCISION MASS, left wrist;  Surgeon: Jonathan Loa, MD;  Location: [facility];  Service: Orthopedics;  Laterality: Left;    Current Outpatient Medications  Medication Sig Dispense Refill  . fenofibrate 160 MG tablet Take 1 tablet (160 mg total) by mouth daily. 90 tablet 3  . meloxicam (MOBIC) 15 MG tablet Take 1 tablet (15 mg total) by mouth daily as needed for pain. Take with a meal 30 tablet 1  . methocarbamol (ROBAXIN) 500 MG tablet Take 1 tablet (500 mg total) by mouth 3 (three) times daily as needed for muscle spasms (back pain). 30 tablet 1  . Multiple Vitamin (MULTIVITAMIN) tablet Take 1 tablet by mouth daily.    . Probiotic Product (PROBIOTIC DAILY) CAPS Take 1 capsule by mouth daily.     No current facility-administered medications for this visit.     Allergies as of 07/17/2017 - Review Complete 07/17/2017   Allergen Reaction Noted  . Cleocin [clindamycin hcl] Hives 10/13/2014    Family History  Problem Relation Age of Onset  . Hypertension Mother   . Diabetes Father   . Colon polyps Father   . Pneumonia Father   . Lung disease Father   . Stroke Maternal Grandfather   . Heart attack Paternal Grandmother     Social History   Socioeconomic History  . Marital status: Married    Spouse name: Not on file  . Number of children: 3  . Years of education: Not on file  . Highest education level: Not on file  Occupational History  . Occupation: welder  Social Needs  . Financial resource strain: Not on file  . Food insecurity:    Worry: Not on file    Inability: Not on file  . Transportation needs:    Medical: Not on file    Non-medical: Not on file  Tobacco Use  . Smoking status: Current Every Day Smoker    Packs/day: 0.50    Years: 15.00    Pack years: 7.50    Types: Cigarettes  . Smokeless tobacco: Former Neurosurgeon    Types: Chew    Quit date: 1997  Substance and Sexual Activity  . Alcohol use: No    Alcohol/week: 0.0 oz  . Drug use: No  . Sexual activity: Not on file  Lifestyle  .  Physical activity:    Days per week: Not on file    Minutes per session: Not on file  . Stress: Not on file  Relationships  . Social connections:    Talks on phone: Not on file    Gets together: Not on file    Attends religious service: Not on file    Active member of club or organization: Not on file    Attends meetings of clubs or organizations: Not on file    Relationship status: Not on file  . Intimate partner violence:    Fear of current or ex partner: Not on file    Emotionally abused: Not on file    Physically abused: Not on file    Forced sexual activity: Not on file  Other Topics Concern  . Not on file  Social History Narrative   Married    3 children    Works in Control and instrumentation engineer       Former heavy smoker - quit in 2009     Physical Exam: Ht  (1.753 m)  Comment: height measured without shoes  Wt 219 lb (99.3 kg)   BMI 32.34 kg/m  Constitutional: generally well-appearing Psychiatric: alert and oriented x3 Eyes: extraocular movements intact Mouth: oral pharynx moist, no lesions Neck: supple no lymphadenopathy Cardiovascular: heart regular rate and rhythm Lungs: clear to auscultation bilaterally Abdomen: soft, nontender, nondistended, no obvious ascites, no peritoneal signs, normal bowel sounds Extremities: no lower extremity edema bilaterally Skin: no lesions on visible extremities Rectal examination: Obvious deflated external hemorrhoidal tissue on examination, subtle feeling of internal anal hemorrhoids as well, no obvious mass lesions on digital rectal examination, stool was brown and not checked for Hemoccult  Assessment and plan: 42 y.o. male with rectal bleeding, minor  This seems most likely benign, anorectal in origin.  Most likely from the hemorrhoid that was noticed.  I recommended colonoscopy to make sure we are not missing anything more significant such as significant neoplasia.  I see no reason for any further blood tests or imaging studies prior to then.    Please see the "Patient Instructions" section for addition details about the plan.   Jonathan Bunting, MD [facility] 07/17/2017, 2:33 PM  Cc: Tower, Audrie Gallus, MD

## 2017-08-03 DIAGNOSIS — M67432 Ganglion, left wrist: Secondary | ICD-10-CM | POA: Diagnosis not present

## 2017-08-03 MED FILL — FENOFIBRATE 160 MG TABLET: 160 | 30 days supply | Qty: 30 | Fill #0

## 2017-09-01 ENCOUNTER — Encounter: Payer: BLUE CROSS/BLUE SHIELD | Admitting: Gastroenterology

## 2017-09-15 ENCOUNTER — Encounter: Payer: BLUE CROSS/BLUE SHIELD | Admitting: Gastroenterology

## 2017-11-30 ENCOUNTER — Telehealth: Payer: Self-pay | Admitting: Family Medicine

## 2017-11-30 DIAGNOSIS — Z125 Encounter for screening for malignant neoplasm of prostate: Secondary | ICD-10-CM | POA: Insufficient documentation

## 2017-11-30 DIAGNOSIS — R7309 Other abnormal glucose: Secondary | ICD-10-CM

## 2017-11-30 DIAGNOSIS — Z0000 Encounter for general adult medical examination without abnormal findings: Secondary | ICD-10-CM

## 2017-11-30 DIAGNOSIS — E781 Pure hyperglyceridemia: Secondary | ICD-10-CM

## 2017-11-30 NOTE — Telephone Encounter (Signed)
-----   Message from Lauren Greeson, RT sent at 11/27/2017  1:18 PM EDT ----- Regarding: Lab orders for Wed 12/06/17 Please enter CPE lab orders for 12/06/17. Thanks! 

## 2017-12-06 ENCOUNTER — Other Ambulatory Visit (INDEPENDENT_AMBULATORY_CARE_PROVIDER_SITE_OTHER): Payer: BLUE CROSS/BLUE SHIELD

## 2017-12-06 DIAGNOSIS — R7309 Other abnormal glucose: Secondary | ICD-10-CM | POA: Diagnosis not present

## 2017-12-06 DIAGNOSIS — E781 Pure hyperglyceridemia: Secondary | ICD-10-CM | POA: Diagnosis not present

## 2017-12-06 DIAGNOSIS — Z125 Encounter for screening for malignant neoplasm of prostate: Secondary | ICD-10-CM | POA: Diagnosis not present

## 2017-12-06 DIAGNOSIS — Z0000 Encounter for general adult medical examination without abnormal findings: Secondary | ICD-10-CM

## 2017-12-06 LAB — CBC WITH DIFFERENTIAL/PLATELET
Basophils Absolute: 0.1 10*3/uL (ref 0.0–0.1)
Basophils Relative: 0.7 % (ref 0.0–3.0)
EOS PCT: 2.3 % (ref 0.0–5.0)
Eosinophils Absolute: 0.2 10*3/uL (ref 0.0–0.7)
HCT: 41.4 % (ref 39.0–52.0)
Hemoglobin: 14.1 g/dL (ref 13.0–17.0)
LYMPHS ABS: 3.2 10*3/uL (ref 0.7–4.0)
Lymphocytes Relative: 31.8 % (ref 12.0–46.0)
MCHC: 34 g/dL (ref 30.0–36.0)
MCV: 86.4 fl (ref 78.0–100.0)
MONOS PCT: 8.1 % (ref 3.0–12.0)
Monocytes Absolute: 0.8 10*3/uL (ref 0.1–1.0)
NEUTROS ABS: 5.7 10*3/uL (ref 1.4–7.7)
NEUTROS PCT: 57.1 % (ref 43.0–77.0)
PLATELETS: 281 10*3/uL (ref 150.0–400.0)
RBC: 4.8 Mil/uL (ref 4.22–5.81)
RDW: 13.9 % (ref 11.5–15.5)
WBC: 10 10*3/uL (ref 4.0–10.5)

## 2017-12-06 LAB — LIPID PANEL
Cholesterol: 205 mg/dL — ABNORMAL HIGH (ref 0–200)
HDL: 28.1 mg/dL — AB (ref >39.00)
NONHDL: 177.01
Total CHOL/HDL Ratio: 7
Triglycerides: 257 mg/dL — ABNORMAL HIGH (ref 0.0–149.0)
VLDL: 51.4 mg/dL — ABNORMAL HIGH (ref 0.0–40.0)

## 2017-12-06 LAB — COMPREHENSIVE METABOLIC PANEL
ALT: 20 U/L (ref 0–53)
AST: 15 U/L (ref 0–37)
Albumin: 4.1 g/dL (ref 3.5–5.2)
Alkaline Phosphatase: 105 U/L (ref 39–117)
BUN: 15 mg/dL (ref 6–23)
CO2: 28 meq/L (ref 19–32)
Calcium: 9.2 mg/dL (ref 8.4–10.5)
Chloride: 108 mEq/L (ref 96–112)
Creatinine, Ser: 0.88 mg/dL (ref 0.40–1.50)
GFR: 101.37 mL/min (ref >60.00)
GLUCOSE: 94 mg/dL (ref 70–99)
POTASSIUM: 4.8 meq/L (ref 3.5–5.1)
Sodium: 140 mEq/L (ref 135–145)
Total Bilirubin: 0.4 mg/dL (ref 0.2–1.2)
Total Protein: 6.9 g/dL (ref 6.0–8.3)

## 2017-12-06 LAB — PSA: PSA: 0.16 ng/mL (ref 0.10–4.00)

## 2017-12-06 LAB — LDL CHOLESTEROL, DIRECT: Direct LDL: 140 mg/dL

## 2017-12-06 LAB — HEMOGLOBIN A1C: HEMOGLOBIN A1C: 6.1 % (ref 4.6–6.5)

## 2017-12-06 LAB — TSH: TSH: 1.66 u[IU]/mL (ref 0.35–4.50)

## 2017-12-12 ENCOUNTER — Ambulatory Visit (INDEPENDENT_AMBULATORY_CARE_PROVIDER_SITE_OTHER): Payer: BLUE CROSS/BLUE SHIELD | Admitting: Family Medicine

## 2017-12-12 ENCOUNTER — Encounter: Payer: Self-pay | Admitting: Family Medicine

## 2017-12-12 VITALS — BP 128/80 | HR 65 | Temp 97.9°F | Ht 69.5 in | Wt 210.8 lb

## 2017-12-12 DIAGNOSIS — Z0000 Encounter for general adult medical examination without abnormal findings: Secondary | ICD-10-CM

## 2017-12-12 DIAGNOSIS — E669 Obesity, unspecified: Secondary | ICD-10-CM

## 2017-12-12 DIAGNOSIS — R7309 Other abnormal glucose: Secondary | ICD-10-CM

## 2017-12-12 DIAGNOSIS — Z23 Encounter for immunization: Secondary | ICD-10-CM

## 2017-12-12 DIAGNOSIS — Z125 Encounter for screening for malignant neoplasm of prostate: Secondary | ICD-10-CM

## 2017-12-12 DIAGNOSIS — F17200 Nicotine dependence, unspecified, uncomplicated: Secondary | ICD-10-CM

## 2017-12-12 DIAGNOSIS — E781 Pure hyperglyceridemia: Secondary | ICD-10-CM | POA: Diagnosis not present

## 2017-12-12 MED ORDER — FENOFIBRATE 160 MG PO TABS: 160.0000 mg | ORAL_TABLET | Freq: Every day | ORAL | 3 refills | Status: DC

## 2017-12-12 MED FILL — FENOFIBRATE 160 MG TABLET: 160 | 90 days supply | Qty: 90 | Fill #0

## 2017-12-12 NOTE — Assessment & Plan Note (Signed)
Commended on wt loss so far Enc him to continue swapping veg and lean protein for junk food  Discussed how this problem influences overall health and the risks it imposes  Reviewed plan for weight loss with lower calorie diet (via better food choices and also portion control or program like weight watchers) and exercise building up to or more than 30 minutes 5 days per week including some aerobic activity

## 2017-12-12 NOTE — Assessment & Plan Note (Signed)
Taking fenofibrate  Trig stable at 257 Watching diet  LDL up to 140 -will continue to watch this  Disc goals for lipids and reasons to control them Rev last labs with pt Rev low sat fat diet in detail

## 2017-12-12 NOTE — Patient Instructions (Signed)
Keep watching fats and simple carbs in your diet   For cholesterol  Avoid red meat/ fried foods/ egg yolks/ fatty breakfast meats/ butter, cheese and high fat dairy/ and shellfish    To avoid diabetes  Try to get most of your carbohydrates from produce (with the exception of white potatoes)  Eat less bread/pasta/rice/snack foods/cereals/sweets and other items from the middle of the grocery store (processed carbs)

## 2017-12-12 NOTE — Assessment & Plan Note (Signed)
Lab Results  Component Value Date   HGBA1C 6.1 12/06/2017   disc imp of low glycemic diet and wt loss to prevent DM2

## 2017-12-12 NOTE — Assessment & Plan Note (Signed)
Disc in detail risks of smoking and possible outcomes including copd, vascular/ heart disease, cancer , respiratory and sinus infections  Pt voices understanding Not yet ready to quit but contemplating it  1/2 ppd

## 2017-12-12 NOTE — Progress Notes (Signed)
Subjective:    Patient ID: Jonathan Choi, male    DOB: [birth year], 42 y.o.   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   Flu shot given today   Wt Readings from Last 3 Encounters:  12/12/17 210 lb 12 oz (95.6 kg)  07/17/17 219 lb (99.3 kg)  06/29/17 220 lb (99.8 kg)  lost 10 lb  Eating better  Has cut out some junk food- for instance avoiding cheese burgers   (chooses chicken instead)  Less fast food  More grilled chicken sandwich  Eating vegetables when he can  30.68 kg/m   Tetanus vac 5/14  HIV screen 2016 neg   Prostate health  No nocturia  No urinary change  Baseline psa  Lab Results  Component Value Date   PSA 0.16 12/06/2017   Pneumonia vaccine 5/14  Smoking status-still at 1/2 ppd  Is trying to quit- some days needs it less Has not set a quit date    fam hx - father with lung cancer  No new changes   Hypertriglyceridemia  Lab Results  Component Value Date   CHOL 205 (H) 12/06/2017   CHOL 212 (H) 11/30/2016   CHOL 196 11/30/2015   Lab Results  Component Value Date   HDL 28.10 (L) 12/06/2017   HDL 43.60 11/30/2016   HDL 34.50 (L) 11/30/2015   Lab Results  Component Value Date   LDLCALC 129 (H) 11/30/2015   LDLCALC 137 (H) 01/16/2015   Lab Results  Component Value Date   TRIG 257.0 (H) 12/06/2017   TRIG 239.0 (H) 11/30/2016   TRIG 163.0 (H) 11/30/2015   Lab Results  Component Value Date   CHOLHDL 7 12/06/2017   CHOLHDL 5 11/30/2016   CHOLHDL 6 11/30/2015   Lab Results  Component Value Date   LDLDIRECT 140.0 12/06/2017   LDLDIRECT 133.0 11/30/2016   LDLDIRECT 147.0 11/27/2014   fenofibrate tx  Is watching diet  LDL is 140  Mother may  have high cholesterol   BP Readings from Last 3 Encounters:  12/12/17 128/80  07/17/17 118/80  06/29/17 126/82   Pulse Readings from Last 3 Encounters:  12/12/17 65  07/17/17 72  06/29/17 82     Other labs Results for orders placed or performed in visit on 12/06/17  PSA  Result Value Ref Range   PSA 0.16 0.10 - 4.00 ng/mL  TSH  Result Value Ref Range   TSH 1.66 0.35 - 4.50 uIU/mL  Lipid panel  Result Value Ref Range   Cholesterol 205 (H) 0 - 200 mg/dL   Triglycerides 409.8 (H) 0.0 - 149.0 mg/dL   HDL 11.91 (L) >47.82 mg/dL   VLDL 95.6 (H) 0.0 - 21.3 mg/dL   Total CHOL/HDL Ratio 7    NonHDL 177.01   Hemoglobin A1c  Result Value Ref Range   Hgb A1c MFr Bld 6.1 4.6 - 6.5 %  Comprehensive metabolic panel  Result Value Ref Range   Sodium 140 135 - 145 mEq/L   Potassium 4.8 3.5 - 5.1 mEq/L   Chloride 108 96 - 112 mEq/L   CO2 28 19 - 32 mEq/L  Glucose, Bld 94 70 - 99 mg/dL   BUN 15 6 - 23 mg/dL   Creatinine, Ser 1.61 0.40 - 1.50 mg/dL   Total Bilirubin 0.4 0.2 - 1.2 mg/dL   Alkaline Phosphatase 105 39 - 117 U/L   AST 15 0 - 37 U/L   ALT 20 0 - 53 U/L   Total Protein 6.9 6.0 - 8.3 g/dL   Albumin 4.1 3.5 - 5.2 g/dL   Calcium 9.2 8.4 - 09.6 mg/dL   GFR 045.40 >98.11 mL/min  CBC with Differential/Platelet  Result Value Ref Range   WBC 10.0 4.0 - 10.5 K/uL   RBC 4.80 4.22 - 5.81 Mil/uL   Hemoglobin 14.1 13.0 - 17.0 g/dL   HCT 91.4 78.2 - 95.6 %   MCV 86.4 78.0 - 100.0 fl   MCHC 34.0 30.0 - 36.0 g/dL   RDW 21.3 08.6 - 57.8 %   Platelets 281.0 150.0 - 400.0 K/uL   Neutrophils Relative % 57.1 43.0 - 77.0 %   Lymphocytes Relative 31.8 12.0 - 46.0 %   Monocytes Relative 8.1 3.0 - 12.0 %   Eosinophils Relative 2.3 0.0 - 5.0 %   Basophils Relative 0.7 0.0 - 3.0 %   Neutro Abs 5.7 1.4 - 7.7 K/uL   Lymphs Abs 3.2 0.7 - 4.0 K/uL   Monocytes Absolute 0.8 0.1 - 1.0 K/uL   Eosinophils Absolute 0.2 0.0 - 0.7 K/uL   Basophils Absolute 0.1 0.0 - 0.1 K/uL  LDL cholesterol,  direct  Result Value Ref Range   Direct LDL 140.0 mg/dL    Elevated glucose in the past  Lab Results  Component Value Date   HGBA1C 6.1 12/06/2017   Patient Active Problem List   Diagnosis Date Noted  . Prostate cancer screening 11/30/2017  . Rectal bleeding 07/12/2017  . Hand pain, left 06/29/2017  . Low back strain 03/31/2017  . Lack of adequate sleep 12/06/2016  . Irritability 12/06/2016  . Elevated glucose 12/06/2016  . Leukocytosis 12/01/2015  . Low libido 10/20/2014  . Hypertriglyceridemia 10/13/2014  . Screening for HIV (human immunodeficiency virus) 10/13/2014  . Routine general medical examination at a health care facility 10/07/2014  . Smoking 04/14/2014  . Obesity (BMI 30-39.9) 04/14/2014   Past Medical History:  Diagnosis Date  . Abrasion of anterior right lower leg 12/22/2015  . Dental crown present   . Ganglion cyst of wrist, left 12/2015  . High triglycerides    Past Surgical History:  Procedure Laterality Date  . KNEE ARTHROSCOPY Right   . MASS EXCISION Left 12/29/2015   Procedure: EXCISION MASS, left wrist;  Surgeon: Betha Loa, MD;  Location: [facility];  Service: Orthopedics;  Laterality: Left;   Social History   Tobacco Use  . Smoking status: Current Every Day Smoker    Packs/day: 0.50    Years: 15.00    Pack years: 7.50    Types: Cigarettes  . Smokeless tobacco: Former Neurosurgeon    Types: Chew    Quit date: 1997  Substance Use Topics  . Alcohol use: No    Alcohol/week: 0.0 standard drinks  . Drug use: No   Family History  Problem Relation Age of Onset  . Hypertension Mother   . Diabetes Father   . Colon polyps Father   . Pneumonia Father   . Lung disease Father   . Stroke Maternal Grandfather   . Heart attack Paternal Grandmother    Allergies  Allergen Reactions  . Cleocin [Clindamycin Hcl]  Hives   Current Outpatient Medications on File Prior to Visit  Medication Sig Dispense Refill  . Multiple Vitamin  (MULTIVITAMIN) tablet Take 1 tablet by mouth daily.    . polyethylene glycol-electrolytes (NULYTELY/GOLYTELY) 420 g solution Take 4,000 mLs by mouth as directed. 4000 mL 0  . Probiotic Product (PROBIOTIC DAILY) CAPS Take 1 capsule by mouth daily.     No current facility-administered medications on file prior to visit.     Review of Systems  Constitutional: Negative for activity change, appetite change, fatigue, fever and unexpected weight change.  HENT: Negative for congestion, rhinorrhea, sore throat and trouble swallowing.   Eyes: Negative for pain, redness, itching and visual disturbance.  Respiratory: Negative for cough, chest tightness, shortness of breath and wheezing.   Cardiovascular: Negative for chest pain and palpitations.  Gastrointestinal: Negative for abdominal pain, blood in stool, constipation, diarrhea and nausea.  Endocrine: Negative for cold intolerance, heat intolerance, polydipsia and polyuria.  Genitourinary: Negative for difficulty urinating, dysuria, frequency and urgency.  Musculoskeletal: Negative for arthralgias, joint swelling and myalgias.  Skin: Negative for pallor and rash.  Neurological: Negative for dizziness, tremors, weakness, numbness and headaches.  Hematological: Negative for adenopathy. Does not bruise/bleed easily.  Psychiatric/Behavioral: Negative for decreased concentration and dysphoric mood. The patient is not nervous/anxious.        Objective:   Physical Exam  Constitutional: He appears well-developed and well-nourished. No distress.  obese and well appearing   HENT:  Head: Normocephalic and atraumatic.  Right Ear: External ear normal.  Left Ear: External ear normal.  Nose: Nose normal.  Mouth/Throat: Oropharynx is clear and moist.  Eyes: Pupils are equal, round, and reactive to light. Conjunctivae and EOM are normal. Right eye exhibits no discharge. Left eye exhibits no discharge. No scleral icterus.  Neck: Normal range of motion. Neck  supple. No JVD present. Carotid bruit is not present. No thyromegaly present.  Cardiovascular: Normal rate, regular rhythm, normal heart sounds and intact distal pulses. Exam reveals no gallop.  Pulmonary/Chest: Effort normal and breath sounds normal. No respiratory distress. He has no wheezes. He exhibits no tenderness.  Abdominal: Soft. Bowel sounds are normal. He exhibits no distension, no abdominal bruit and no mass. There is no tenderness.  Musculoskeletal: He exhibits no edema, tenderness or deformity.  Lymphadenopathy:    He has no cervical adenopathy.  Neurological: He is alert. He has normal reflexes. He displays normal reflexes. No cranial nerve deficit. He exhibits normal muscle tone. Coordination normal.  Skin: Skin is warm and dry. No rash noted. No erythema. No pallor.  Tanned Solar lentigines diffusely   Psychiatric: He has a normal mood and affect.  Pleasant  Generally improved mood          Assessment & Plan:   Problem List Items Addressed This Visit      Other   Elevated glucose    Lab Results  Component Value Date   HGBA1C 6.1 12/06/2017   disc imp of low glycemic diet and wt loss to prevent DM2       Hypertriglyceridemia    Taking fenofibrate  Trig stable at 257 Watching diet  LDL up to 140 -will continue to watch this  Disc goals for lipids and reasons to control them Rev last labs with pt Rev low sat fat diet in detail       Relevant Medications   fenofibrate 160 MG tablet   Obesity (BMI 30-39.9)    Commended on wt loss so far Enc  him to continue swapping veg and lean protein for junk food  Discussed how this problem influences overall health and the risks it imposes  Reviewed plan for weight loss with lower calorie diet (via better food choices and also portion control or program like weight watchers) and exercise building up to or more than 30 minutes 5 days per week including some aerobic activity         Prostate cancer screening    Lab  Results  Component Value Date   PSA 0.16 12/06/2017   No symptoms       Routine general medical examination at a health care facility - Primary    Reviewed health habits including diet and exercise and skin cancer prevention Reviewed appropriate screening tests for age  Also reviewed health mt list, fam hx and immunization status , as well as social and family history   See HPI Labs reviewed  Flu shot today  Counseled on smoking cessation  Also diet/exercise  Doing better with more rest lately and better self care      Smoking    Disc in detail risks of smoking and possible outcomes including copd, vascular/ heart disease, cancer , respiratory and sinus infections  Pt voices understanding Not yet ready to quit but contemplating it  1/2 ppd        Other Visit Diagnoses    Need for influenza vaccination       Relevant Orders   Flu Vaccine QUAD 6+ mos PF IM (Fluarix Quad PF) (Completed)

## 2017-12-12 NOTE — Assessment & Plan Note (Signed)
Lab Results  Component Value Date   PSA 0.16 12/06/2017   No symptoms

## 2017-12-12 NOTE — Assessment & Plan Note (Signed)
Reviewed health habits including diet and exercise and skin cancer prevention Reviewed appropriate screening tests for age  Also reviewed health mt list, fam hx and immunization status , as well as social and family history   See HPI Labs reviewed  Flu shot today  Counseled on smoking cessation  Also diet/exercise  Doing better with more rest lately and better self care

## 2017-12-21 DIAGNOSIS — S63501A Unspecified sprain of right wrist, initial encounter: Secondary | ICD-10-CM | POA: Diagnosis not present

## 2018-02-22 ENCOUNTER — Encounter: Payer: Self-pay | Admitting: Family Medicine

## 2018-07-09 ENCOUNTER — Encounter: Payer: Self-pay | Admitting: Family Medicine

## 2018-11-05 ENCOUNTER — Encounter: Payer: Self-pay | Admitting: Family Medicine

## 2018-11-06 ENCOUNTER — Other Ambulatory Visit: Payer: Self-pay

## 2018-11-06 ENCOUNTER — Ambulatory Visit: Payer: BLUE CROSS/BLUE SHIELD | Admitting: Family Medicine

## 2018-11-06 ENCOUNTER — Encounter: Payer: Self-pay | Admitting: Family Medicine

## 2018-11-06 VITALS — BP 116/70 | HR 73 | Temp 98.5°F | Ht 69.5 in | Wt 223.1 lb

## 2018-11-06 DIAGNOSIS — M25511 Pain in right shoulder: Secondary | ICD-10-CM | POA: Diagnosis not present

## 2018-11-06 MED ORDER — NAPROXEN 500 MG PO TABS: ORAL_TABLET | ORAL | 0 refills | Status: DC

## 2018-11-06 MED ORDER — KETOROLAC TROMETHAMINE 60 MG/2ML IM SOLN
60.0000 mg | Freq: Once | INTRAMUSCULAR | Status: AC
  Administered 2018-11-06: 60 mg via INTRAMUSCULAR

## 2018-11-06 NOTE — Patient Instructions (Signed)
I think you have rotator cuff tendonitis after repetitive use yesterday Treat with toradol anti inflammatory shot today then tomorrow start naprosyn 500mg  twice daily for 1 week.  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 Continue ice for 2 days then heating pad for 15-20 min at a time, 2-3 times a day (whatever feels better).  Once shoulder pain improving, do exercises provided today.  Let us know if not improving as expected.

## 2018-11-06 NOTE — Progress Notes (Signed)
This visit was conducted in person.  BP 116/70 (BP Location: Left Arm, Patient Position: Sitting, Cuff Size: Large)   Pulse 73   Temp 98.5 F (36.9 C) (Temporal)   Ht 5' 9.5" (1.765 m)   Wt 223 lb 1 oz (101.2 kg)   SpO2 94%   BMI 32.47 kg/m    CC: R shoulder pain Subjective:    Patient ID: Jonathan Choi, male    DOB: 01-02-1976, 43 y.o.   MRN: [id number]  HPI: Jonathan Choi is a 43 y.o. male presenting on 11/06/2018 for Shoulder Pain (C/o right shoulder pain.  Started yesterday.  Has had similar pain 8-10 yrs ago, received cortisone inj. Tried ibuprofen, barely helpful. )   R handed.   1d h/o R shoulder pain - after running hand held grinder for an hour. No pain at the time, but much worse pain when he got home - points to lateral upper shoulder at deltoid - worse pain with any movement of the shoulder. Ibuprofen 600mg  without much improvement. Also tried icy hot patches with no improvement. Ice did help. Constant pain started last night. No neck pain, shooting pain down arms, numbness or weakness down arms.   Similar episode 8-10 yrs ago after fall onto shoulder while playing football. He did receive steroid injection at the time with improvement. 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      Relevant past medical, surgical, family and social history reviewed and updated as indicated. Interim medical history since our last visit reviewed. Allergies and medications reviewed and updated. Outpatient Medications Prior to Visit  Medication Sig Dispense Refill  . fenofibrate 160 MG tablet Take 1 tablet (160 mg total) by mouth daily. 90 tablet 3  . Multiple Vitamin (MULTIVITAMIN) tablet Take 1 tablet by mouth daily.    . Probiotic Product (PROBIOTIC DAILY) CAPS Take 1 capsule by mouth daily.    . polyethylene glycol-electrolytes (NULYTELY/GOLYTELY) 420 g solution Take 4,000 mLs by mouth as directed. 4000 mL 0   No facility-administered medications prior to visit.       Per HPI unless specifically indicated in ROS section below Review of Systems Objective:    BP 116/70 (BP Location: Left Arm, Patient Position: Sitting, Cuff Size: Large)   Pulse 73   Temp 98.5 F (36.9 C) (Temporal)   Ht 5' 9.5" (1.765 m)   Wt 223 lb 1 oz (101.2 kg)   SpO2 94%   BMI 32.47 kg/m   Wt Readings from Last 3 Encounters:  11/06/18 223 lb 1 oz (101.2 kg)  12/12/17 210 lb 12 oz (95.6 kg)  07/17/17 219 lb (99.3 kg)    Physical Exam Vitals signs and nursing note reviewed.  Constitutional:      General: He is not in acute distress.    Appearance: Normal appearance. He is not ill-appearing.  Musculoskeletal:        General: No swelling or tenderness.     Comments:  L shoulder WNL R shoulder exam: No deformity of shoulders on inspection.  No significant pain with palpation of shoulder landmarks. Limited ROM in abduction and forward flexion past 90 degrees, more tolerable ROM with passive movement. ++ pain and weakness with testing SITS in ext/int rotation. + pain with empty can sign. Neg Speed test. No significant impingement. No pain with crossover test. Discomfort but no pain with rotation of humeral head in [place] joint.   Neurological:     Mental Status: He is alert.  Assessment & Plan:   Problem List Items Addressed This Visit    Acute pain of right shoulder - Primary    Anticipate RTC tendonitis after repetitive use yesterday with grinder. Treat with toradol 60mg  IM x1, then naprosyn course. No trauma history. [facility] on [facility] injury. Update if not improving with treatment, consider f/u with SM.           Meds ordered this encounter  Medications  . naproxen (NAPROSYN) 500 MG tablet    Sig: Take one po bid x 1 week then prn pain, take with food    Dispense:  40 tablet    Refill:  0   No orders of the defined types were placed in this encounter.   Patient Instructions  I think you have rotator cuff  tendonitis after repetitive use yesterday Treat with toradol anti inflammatory shot today then tomorrow start naprosyn 500mg  twice daily for 1 week.  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 Continue ice for 2 days then heating pad for 15-20 min at a time, 2-3 times a day (whatever feels better).  Once shoulder pain improving, do exercises provided today.  Let us know if not improving as expected.    Follow up plan: Return if symptoms worsen or fail to improve.  Eustaquio BoydenJavier Shakya Sebring, MD

## 2018-11-06 NOTE — Addendum Note (Signed)
Addended by: Brenton Grills on: 3/57/8978 47:84 AM   Modules accepted: Orders

## 2018-11-06 NOTE — Assessment & Plan Note (Addendum)
Anticipate RTC tendonitis after repetitive use yesterday with grinder. Treat with toradol 60mg  IM x1, then naprosyn course. No trauma history. Provided with exercises from [facility] pt advisor on RTC injury. Update if not improving with treatment, consider f/u with SM.

## 2018-11-08 ENCOUNTER — Encounter: Payer: Self-pay | Admitting: Family Medicine

## 2018-12-10 ENCOUNTER — Encounter: Payer: Self-pay | Admitting: Family Medicine

## 2018-12-10 MED ORDER — HYDROCORTISONE ACETATE 25 MG RE SUPP: 25.0000 mg | Freq: Two times a day (BID) | RECTAL | 0 refills | Status: DC

## 2018-12-17 ENCOUNTER — Telehealth: Payer: Self-pay | Admitting: Family Medicine

## 2018-12-17 DIAGNOSIS — Z125 Encounter for screening for malignant neoplasm of prostate: Secondary | ICD-10-CM

## 2018-12-17 DIAGNOSIS — E781 Pure hyperglyceridemia: Secondary | ICD-10-CM

## 2018-12-17 DIAGNOSIS — Z0000 Encounter for general adult medical examination without abnormal findings: Secondary | ICD-10-CM

## 2018-12-17 DIAGNOSIS — R7309 Other abnormal glucose: Secondary | ICD-10-CM

## 2018-12-17 NOTE — Telephone Encounter (Signed)
-----   Message from Ellamae Sia sent at 12/11/2018  2:52 PM EDT ----- Regarding: Lab orders for Tuesday, 9.29.20 Patient is scheduled for CPX labs, please order future labs, Thanks , Karna Christmas

## 2018-12-18 ENCOUNTER — Other Ambulatory Visit: Payer: Self-pay

## 2018-12-18 ENCOUNTER — Other Ambulatory Visit (INDEPENDENT_AMBULATORY_CARE_PROVIDER_SITE_OTHER): Payer: BC Managed Care – PPO

## 2018-12-18 DIAGNOSIS — E781 Pure hyperglyceridemia: Secondary | ICD-10-CM | POA: Diagnosis not present

## 2018-12-18 DIAGNOSIS — R7309 Other abnormal glucose: Secondary | ICD-10-CM | POA: Diagnosis not present

## 2018-12-18 DIAGNOSIS — Z0000 Encounter for general adult medical examination without abnormal findings: Secondary | ICD-10-CM | POA: Diagnosis not present

## 2018-12-18 LAB — CBC WITH DIFFERENTIAL/PLATELET
Basophils Absolute: 0.1 10*3/uL (ref 0.0–0.1)
Basophils Relative: 0.8 % (ref 0.0–3.0)
Eosinophils Absolute: 0.3 10*3/uL (ref 0.0–0.7)
Eosinophils Relative: 2.5 % (ref 0.0–5.0)
HCT: 40.5 % (ref 39.0–52.0)
Hemoglobin: 13.7 g/dL (ref 13.0–17.0)
Lymphocytes Relative: 29 % (ref 12.0–46.0)
Lymphs Abs: 3.7 10*3/uL (ref 0.7–4.0)
MCHC: 33.8 g/dL (ref 30.0–36.0)
MCV: 88.4 fl (ref 78.0–100.0)
Monocytes Absolute: 1 10*3/uL (ref 0.1–1.0)
Monocytes Relative: 7.8 % (ref 3.0–12.0)
Neutro Abs: 7.6 10*3/uL (ref 1.4–7.7)
Neutrophils Relative %: 59.9 % (ref 43.0–77.0)
Platelets: 338 10*3/uL (ref 150.0–400.0)
RBC: 4.58 Mil/uL (ref 4.22–5.81)
RDW: 14.3 % (ref 11.5–15.5)
WBC: 12.7 10*3/uL — ABNORMAL HIGH (ref 4.0–10.5)

## 2018-12-18 LAB — COMPREHENSIVE METABOLIC PANEL
ALT: 23 U/L (ref 0–53)
AST: 19 U/L (ref 0–37)
Albumin: 4.5 g/dL (ref 3.5–5.2)
Alkaline Phosphatase: 83 U/L (ref 39–117)
BUN: 24 mg/dL — ABNORMAL HIGH (ref 6–23)
CO2: 22 mEq/L (ref 19–32)
Calcium: 10 mg/dL (ref 8.4–10.5)
Chloride: 109 mEq/L (ref 96–112)
Creatinine, Ser: 1.02 mg/dL (ref 0.40–1.50)
GFR: 80.03 mL/min (ref >60.00)
Glucose, Bld: 94 mg/dL (ref 70–99)
Potassium: 4.7 mEq/L (ref 3.5–5.1)
Sodium: 139 mEq/L (ref 135–145)
Total Bilirubin: 0.3 mg/dL (ref 0.2–1.2)
Total Protein: 7.2 g/dL (ref 6.0–8.3)

## 2018-12-18 LAB — LIPID PANEL
Cholesterol: 208 mg/dL — ABNORMAL HIGH (ref 0–200)
HDL: 34.3 mg/dL — ABNORMAL LOW (ref >39.00)
NonHDL: 174.03
Total CHOL/HDL Ratio: 6
Triglycerides: 285 mg/dL — ABNORMAL HIGH (ref 0.0–149.0)
VLDL: 57 mg/dL — ABNORMAL HIGH (ref 0.0–40.0)

## 2018-12-18 LAB — TSH: TSH: 1.97 u[IU]/mL (ref 0.35–4.50)

## 2018-12-18 LAB — HEMOGLOBIN A1C: Hgb A1c MFr Bld: 6 % (ref 4.6–6.5)

## 2018-12-18 LAB — LDL CHOLESTEROL, DIRECT: Direct LDL: 143 mg/dL

## 2018-12-21 ENCOUNTER — Encounter: Payer: BLUE CROSS/BLUE SHIELD | Admitting: Family Medicine

## 2018-12-24 ENCOUNTER — Ambulatory Visit (INDEPENDENT_AMBULATORY_CARE_PROVIDER_SITE_OTHER): Payer: BC Managed Care – PPO | Admitting: Family Medicine

## 2018-12-24 ENCOUNTER — Encounter: Payer: Self-pay | Admitting: Family Medicine

## 2018-12-24 ENCOUNTER — Other Ambulatory Visit: Payer: Self-pay

## 2018-12-24 VITALS — BP 128/72 | HR 73 | Temp 97.2°F | Ht 69.25 in | Wt 217.5 lb

## 2018-12-24 DIAGNOSIS — R7309 Other abnormal glucose: Secondary | ICD-10-CM

## 2018-12-24 DIAGNOSIS — E669 Obesity, unspecified: Secondary | ICD-10-CM

## 2018-12-24 DIAGNOSIS — E781 Pure hyperglyceridemia: Secondary | ICD-10-CM | POA: Diagnosis not present

## 2018-12-24 DIAGNOSIS — D72829 Elevated white blood cell count, unspecified: Secondary | ICD-10-CM

## 2018-12-24 DIAGNOSIS — IMO0001 Reserved for inherently not codable concepts without codable children: Secondary | ICD-10-CM

## 2018-12-24 DIAGNOSIS — Z0000 Encounter for general adult medical examination without abnormal findings: Secondary | ICD-10-CM

## 2018-12-24 DIAGNOSIS — Z23 Encounter for immunization: Secondary | ICD-10-CM | POA: Diagnosis not present

## 2018-12-24 DIAGNOSIS — F17200 Nicotine dependence, unspecified, uncomplicated: Secondary | ICD-10-CM | POA: Diagnosis not present

## 2018-12-24 DIAGNOSIS — K429 Umbilical hernia without obstruction or gangrene: Secondary | ICD-10-CM

## 2018-12-24 NOTE — Assessment & Plan Note (Signed)
Pt has had several episodes of brief but significant pain after straining/lifting Ref done to gen surg  Disc s/s of incarcerated hernia- when to get medical attention  Will avoid heavy lifting/straining for now

## 2018-12-24 NOTE — Assessment & Plan Note (Signed)
Stable with fenofibrate  LDL needs to be watched - prefer under 140  Hesitant to add statin but may need to later  Disc goals for lipids and reasons to control them Rev last labs with pt Rev low sat fat diet in detail Working more on diet and exercise now  He will try to limit red meat

## 2018-12-24 NOTE — Assessment & Plan Note (Signed)
Lab Results  Component Value Date   HGBA1C 6.0 12/18/2018   disc imp of low glycemic diet and wt loss to prevent DM2  Pt is starting to work on diet and exercise

## 2018-12-24 NOTE — Assessment & Plan Note (Signed)
Mild in the setting of stress and smoking Has had this before No s/s of infection  Will watch

## 2018-12-24 NOTE — Assessment & Plan Note (Signed)
Reviewed health habits including diet and exercise and skin cancer prevention Reviewed appropriate screening tests for age  Also reviewed health mt list, fam hx and immunization status , as well as social and family history   See HPI Labs reviewed  Flu vaccine given  Disc diet for cholesterol/prediabetes

## 2018-12-24 NOTE — Assessment & Plan Note (Signed)
Discussed how this problem influences overall health and the risks it imposes  Reviewed plan for weight loss with lower calorie diet (via better food choices and also portion control or program like weight watchers) and exercise building up to or more than 30 minutes 5 days per week including some aerobic activity    

## 2018-12-24 NOTE — Progress Notes (Signed)
Subjective:    Patient ID: Jonathan Choi, male    DOB: [birth year], 43 y.o.   MRN: [id number]  HPI Here for health maintenance exam and to review chronic medical problems   Wt Readings from Last 3 Encounters:  12/24/18 217 lb 8 oz (98.7 kg)  11/06/18 223 lb 1 oz (101.2 kg)  12/12/17 210 lb 12 oz (95.6 kg)  some weight loss lately  Grilling more - healthier eating  3 weeks without an energy drink - pleased with this  Job is physical  Trying to play golf a lot more  Was up to 230 at highest - eating emotionally  31.89 kg/m  Trying to care for himself  Son went in the army  Now in [place]  Does face time with him   Physically feeling ok - occ shoulder problems  Feels down occ-this was a very hard year stress wise  Work/family/pandemic Gets through it day by day   Has umbilical hernia- interested in tx in the past   Flu vaccine-today  Tdap 5/14 PNA 23 7/16   Prostate health  Baseline psa nl a year ago Lab Results  Component Value Date   PSA 0.16 12/06/2017   no problems at all  No nocturia but he wakes up a little earlier to urinate   Smoking status - about the same  1/2 ppd  Not ready to quit yet     BP Readings from Last 3 Encounters:  12/24/18 128/72  11/06/18 116/70  12/12/17 128/80   Pulse Readings from Last 3 Encounters:  12/24/18 73  11/06/18 73  12/12/17 65    Cholesterol Lab Results  Component Value Date   CHOL 208 (H) 12/18/2018   CHOL 205 (H) 12/06/2017   CHOL 212 (H) 11/30/2016   Lab Results  Component Value Date   HDL 34.30 (L) 12/18/2018   HDL 28.10 (L) 12/06/2017   HDL 43.60 11/30/2016   Lab Results  Component Value Date   LDLCALC 129 (H) 11/30/2015   LDLCALC 137 (H) 01/16/2015   Lab Results  Component Value Date   TRIG 285.0 (H) 12/18/2018   TRIG 257.0 (H) 12/06/2017   TRIG 239.0 (H) 11/30/2016   Lab Results  Component Value Date   CHOLHDL 6 12/18/2018   CHOLHDL 7 12/06/2017   CHOLHDL 5 11/30/2016   Lab Results   Component Value Date   LDLDIRECT 143.0 12/18/2018   LDLDIRECT 140.0 12/06/2017   LDLDIRECT 133.0 11/30/2016   Takes fenofibrate for triglycerides  Grilling more  Avoiding fried foods  No chips or donuts  Red meat is his weakness but does eat a lot of chicken    H/o elevated glucose Lab Results  Component Value Date   HGBA1C 6.0 12/18/2018  glucose  was 94  Down from 6.1     Lab Results  Component Value Date   CREATININE 1.02 12/18/2018   BUN 24 (H) 12/18/2018   NA 139 12/18/2018   K 4.7 12/18/2018   CL 109 12/18/2018   CO2 22 12/18/2018   Lab Results  Component Value Date   ALT 23 12/18/2018   AST 19 12/18/2018   ALKPHOS 83 12/18/2018   BILITOT 0.3 12/18/2018   Lab Results  Component Value Date   TSH 1.97 12/18/2018    Lab Results  Component Value Date   WBC 12.7 (H) 12/18/2018   HGB 13.7 12/18/2018   HCT 40.5 12/18/2018   MCV 88.4 12/18/2018   PLT 338.0 12/18/2018  Has had slt elevated wbc  Differential is normal  No infections currently   Patient Active Problem List   Diagnosis Date Noted  . Umbilical hernia 12/24/2018  . Acute pain of right shoulder 11/06/2018  . Prostate cancer screening 11/30/2017  . Rectal bleeding 07/12/2017  . Low back strain 03/31/2017  . Elevated glucose 12/06/2016  . Low libido 10/20/2014  . Hypertriglyceridemia 10/13/2014  . Screening for HIV (human immunodeficiency virus) 10/13/2014  . Routine general medical examination at a health care facility 10/07/2014  . Smoking 04/14/2014  . Obesity (BMI 30-39.9) 04/14/2014   Past Medical History:  Diagnosis Date  . Abrasion of anterior right lower leg 12/22/2015  . Dental crown present   . Ganglion cyst of wrist, left 12/2015  . High triglycerides    Past Surgical History:  Procedure Laterality Date  . KNEE ARTHROSCOPY Right   . MASS EXCISION Left 12/29/2015   Procedure: EXCISION MASS, left wrist;  Surgeon: Betha Loa, MD;  Location: [facility];   Service: Orthopedics;  Laterality: Left;   Social History   Tobacco Use  . Smoking status: Current Every Day Smoker    Packs/day: 0.50    Years: 15.00    Pack years: 7.50    Types: Cigarettes  . Smokeless tobacco: Former Neurosurgeon    Types: Chew    Quit date: 1997  Substance Use Topics  . Alcohol use: No    Alcohol/week: 0.0 standard drinks  . Drug use: No   Family History  Problem Relation Age of Onset  . Hypertension Mother   . Diabetes Father   . Colon polyps Father   . Pneumonia Father   . Lung disease Father   . Stroke Maternal Grandfather   . Heart attack Paternal Grandmother    Allergies  Allergen Reactions  . Cleocin [Clindamycin Hcl] Hives   Current Outpatient Medications on File Prior to Visit  Medication Sig Dispense Refill  . fenofibrate 160 MG tablet Take 1 tablet (160 mg total) by mouth daily. 90 tablet 3  . hydrocortisone (ANUSOL-HC) 25 MG suppository Place 1 suppository (25 mg total) rectally 2 (two) times daily. 12 suppository 0  . Multiple Vitamin (MULTIVITAMIN) tablet Take 1 tablet by mouth daily.    . Probiotic Product (PROBIOTIC DAILY) CAPS Take 1 capsule by mouth daily.     No current facility-administered medications on file prior to visit.      Review of Systems  Constitutional: Negative for activity change, appetite change, fatigue, fever and unexpected weight change.  HENT: Negative for congestion, rhinorrhea, sore throat and trouble swallowing.   Eyes: Negative for pain, redness, itching and visual disturbance.  Respiratory: Negative for cough, chest tightness, shortness of breath and wheezing.   Cardiovascular: Negative for chest pain and palpitations.  Gastrointestinal: Negative for abdominal pain, blood in stool, constipation, diarrhea and nausea.  Endocrine: Negative for cold intolerance, heat intolerance, polydipsia and polyuria.  Genitourinary: Negative for difficulty urinating, dysuria, frequency and urgency.  Musculoskeletal: Negative  for arthralgias, joint swelling and myalgias.  Skin: Negative for pallor and rash.  Neurological: Negative for dizziness, tremors, weakness, numbness and headaches.  Hematological: Negative for adenopathy. Does not bruise/bleed easily.  Psychiatric/Behavioral: Negative for decreased concentration and dysphoric mood. The patient is not nervous/anxious.        Stressors -worse lately       Objective:   Physical Exam Constitutional:      General: He is not in acute distress.    Appearance: Normal  appearance. He is well-developed. He is obese. He is not ill-appearing or diaphoretic.  HENT:     Head: Normocephalic and atraumatic.     Right Ear: Tympanic membrane, ear canal and external ear normal.     Left Ear: Tympanic membrane, ear canal and external ear normal.     Nose: Nose normal. No congestion.     Mouth/Throat:     Mouth: Mucous membranes are moist.     Pharynx: Oropharynx is clear. No posterior oropharyngeal erythema.  Eyes:     General: No scleral icterus.       Right eye: No discharge.        Left eye: No discharge.     Conjunctiva/sclera: Conjunctivae normal.     Pupils: Pupils are equal, round, and reactive to light.  Neck:     Musculoskeletal: Normal range of motion and neck supple. No neck rigidity or muscular tenderness.     Thyroid: No thyromegaly.     Vascular: No carotid bruit or JVD.  Cardiovascular:     Rate and Rhythm: Normal rate and regular rhythm.     Pulses: Normal pulses.     Heart sounds: Normal heart sounds. No gallop.   Pulmonary:     Effort: Pulmonary effort is normal. No respiratory distress.     Breath sounds: Normal breath sounds. No wheezing or rales.     Comments: Good air exch Chest:     Chest wall: No tenderness.  Abdominal:     General: Bowel sounds are normal. There is no distension or abdominal bruit.     Palpations: Abdomen is soft. There is no mass.     Tenderness: There is no abdominal tenderness.     Hernia: No hernia is present.   Musculoskeletal:        General: No tenderness.     Right lower leg: No edema.     Left lower leg: No edema.     Comments: No acute joint changes   Lymphadenopathy:     Cervical: No cervical adenopathy.  Skin:    General: Skin is warm and dry.     Coloration: Skin is not pale.     Findings: No erythema or rash.     Comments: Tanned Some solar aging changes Solar lentigines diffusely    Neurological:     Mental Status: He is alert. Mental status is at baseline.     Cranial Nerves: No cranial nerve deficit.     Motor: No abnormal muscle tone.     Coordination: Coordination normal.     Gait: Gait normal.     Deep Tendon Reflexes: Reflexes are normal and symmetric. Reflexes normal.  Psychiatric:        Mood and Affect: Mood normal. Mood is not depressed.        Cognition and Memory: Cognition and memory normal.     Comments: Pleasant and talkative  Not seemingly down today           Assessment & Plan:   Problem List Items Addressed This Visit      Other   Smoking    1/2 ppd currently  Disc in detail risks of smoking and possible outcomes including copd, vascular/ heart disease, cancer , respiratory and sinus infections  Pt voices understanding Pt states too much stress to quit but wants to consider in the future        Obesity (BMI 30-39.9)    Discussed how this problem influences overall health and the  risks it imposes  Reviewed plan for weight loss with lower calorie diet (via better food choices and also portion control or program like weight watchers) and exercise building up to or more than 30 minutes 5 days per week including some aerobic activity         Routine general medical examination at a health care facility - Primary    Reviewed health habits including diet and exercise and skin cancer prevention Reviewed appropriate screening tests for age  Also reviewed health mt list, fam hx and immunization status , as well as social and family history   See  HPI Labs reviewed  Flu vaccine given  Disc diet for cholesterol/prediabetes         Hypertriglyceridemia    Stable with fenofibrate  LDL needs to be watched - prefer under 140  Hesitant to add statin but may need to later  Disc goals for lipids and reasons to control them Rev last labs with pt Rev low sat fat diet in detail Working more on diet and exercise now  He will try to limit red meat        Elevated white blood cell count    Mild in the setting of stress and smoking Has had this before No s/s of infection  Will watch       Elevated glucose    Lab Results  Component Value Date   HGBA1C 6.0 12/18/2018   disc imp of low glycemic diet and wt loss to prevent DM2  Pt is starting to work on diet and exercise       Umbilical hernia    Pt has had several episodes of brief but significant pain after straining/lifting Ref done to gen surg  Disc s/s of incarcerated hernia- when to get medical attention  Will avoid heavy lifting/straining for now        Relevant Orders   Ambulatory referral to General Surgery    Other Visit Diagnoses    Need for influenza vaccination       Relevant Orders   Flu Vaccine QUAD 6+ mos PF IM (Fluarix Quad PF) (Completed)

## 2018-12-24 NOTE — Assessment & Plan Note (Signed)
1/2 ppd currently  Disc in detail risks of smoking and possible outcomes including copd, vascular/ heart disease, cancer , respiratory and sinus infections  Pt voices understanding Pt states too much stress to quit but wants to consider in the future

## 2018-12-24 NOTE — Patient Instructions (Addendum)
For cholesterol Avoid red meat/ fried foods/ egg yolks/ fatty breakfast meats/ butter, cheese and high fat dairy/ and shellfish     Flu shot today  Use sunscreen when needed   I will place a general surgical referral -the office will call you  For umbilical hernia

## 2019-02-06 DIAGNOSIS — Z20828 Contact with and (suspected) exposure to other viral communicable diseases: Secondary | ICD-10-CM | POA: Diagnosis not present

## 2019-03-23 DIAGNOSIS — J029 Acute pharyngitis, unspecified: Secondary | ICD-10-CM | POA: Diagnosis not present

## 2019-03-23 DIAGNOSIS — R519 Headache, unspecified: Secondary | ICD-10-CM | POA: Diagnosis not present

## 2019-03-23 DIAGNOSIS — Z20822 Contact with and (suspected) exposure to covid-19: Secondary | ICD-10-CM | POA: Diagnosis not present

## 2019-03-28 ENCOUNTER — Other Ambulatory Visit: Payer: Self-pay | Admitting: Family Medicine

## 2019-04-05 ENCOUNTER — Ambulatory Visit: Payer: Self-pay | Admitting: General Surgery

## 2019-04-05 DIAGNOSIS — K429 Umbilical hernia without obstruction or gangrene: Secondary | ICD-10-CM | POA: Diagnosis not present

## 2019-04-16 ENCOUNTER — Encounter: Payer: Self-pay | Admitting: Family Medicine

## 2019-04-18 ENCOUNTER — Other Ambulatory Visit: Payer: Self-pay

## 2019-04-18 ENCOUNTER — Encounter: Payer: Self-pay | Admitting: Family Medicine

## 2019-04-18 ENCOUNTER — Ambulatory Visit: Payer: BC Managed Care – PPO | Admitting: Family Medicine

## 2019-04-18 VITALS — BP 130/80 | HR 77 | Temp 97.2°F | Ht 69.25 in | Wt 226.0 lb

## 2019-04-18 DIAGNOSIS — M7712 Lateral epicondylitis, left elbow: Secondary | ICD-10-CM | POA: Diagnosis not present

## 2019-04-18 MED ORDER — PREDNISONE 20 MG PO TABS: ORAL_TABLET | ORAL | 0 refills | Status: DC

## 2019-04-18 MED ORDER — METHYLPREDNISOLONE ACETATE 40 MG/ML IJ SUSP
20.0000 mg | Freq: Once | INTRAMUSCULAR | Status: AC
  Administered 2019-04-18: 16:00:00 20 mg via INTRA_ARTICULAR

## 2019-04-18 NOTE — Progress Notes (Signed)
Jonathan Choi T. Malee Grays, MD Primary Care and Sports Medicine [facility] [address] [place], [address] Phone: [phone]  FAX: [phone]  Jonathan Choi - 44 y.o. male  MRN [id number]  Date of Birth: 1975-10-30  Visit Date: 04/18/2019  PCP: Jonathan Pimple, MD  Referred by: Choi, Jonathan Gallus, MD  Chief Complaint  Patient presents with  . Elbow Pain    Left x 2 1/2 weeks-No injury    This visit occurred during the SARS-CoV-2 public health emergency.  Safety protocols were in place, including screening questions prior to the visit, additional usage of staff PPE, and extensive cleaning of exam room while observing appropriate contact time as indicated for disinfecting solutions.   Subjective:   Jonathan Choi presents with lateral elbow pain.  Length of symptoms: 3 weeks Hand effected: L  Patient describes a dull ache on the lateral elbow. There is some translation in the proximal forearm and in the distal upper arm. It is painful to lift with the hand facing down and to lift with the thumb in an upright position. Supination is painful. Patient points to the lateral epicondyle as the point of maximal tenderness near ECRB.  This began with no acute event.  He did feel like he had some acute worsening yesterday.  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  He is a pleasant gentleman and he presents with some ongoing left-sided elbow pain. A lot worse yesterday.  Did hurt a little bit but yestrday wa much worse.   Ice motrin, compression Tennis elbow  Welder  10 hours a day, 5-6 days a week.  LE L and inject No trauma.   No prior fractures or operative interventions in the effective hand. Prior PT or HEP: none  Denies numbness or tingling. No significant neck or shoulder pain.  Hand of dominance: R  Past Medical History, Surgical History, Social History, Family History, Problem List, Medications,  and Allergies have been reviewed and updated if relevant.  GEN: No fevers, chills. Nontoxic. Primarily MSK c/o today. MSK: Detailed in the HPI GI: tolerating PO intake without difficulty Neuro: No numbness, parasthesias, or tingling associated. Otherwise the pertinent positives of the ROS are noted above.   Objective:   Blood pressure 130/80, pulse 77, temperature (!) 97.2 F (36.2 C), temperature source Temporal, height 5' 9.25" (1.759 m), weight 226 lb (102.5 kg), SpO2 96 %.  GEN: Well-developed,well-nourished,in no acute distress; alert,appropriate and cooperative throughout examination HEENT: Normocephalic and atraumatic without obvious abnormalities. Ears, externally no deformities PULM: Breathing comfortably in no respiratory distress EXT: No clubbing, cyanosis, or edema PSYCH: Normally interactive. Cooperative during the interview. Pleasant. Friendly and conversant. Not anxious or depressed appearing. Normal, full affect.  L elbow Ecchymosis or edema: neg ROM: full flexion, extension, pronation, supination Shoulder ROM: Full Flexion: 5/5 Extension: 5/5, PAINFUL Supination: 5/5, PAINFUL Pronation: 5/5 Wrist ext: 5/5 Wrist flexion: 5/5 No gross bony abnormality Varus and Valgus stress: stable ECRB tenderness: YES, TTP Medial epicondyle: NT Lateral epicondyle, resisted wrist extension from wrist full pronation and flexion: PAINFUL grip: 5/5  sensation intact Tinel's, Elbow: negative  Subjective:     ICD-10-CM   1. Lateral epicondylitis, left elbow  M77.12 methylPREDNISolone acetate (DEPO-MEDROL) injection 20 mg    Elbow anatomy was reviewed, and tendinopathy was explained.  Pt. given a formal rehab program from [facility] on elbow rehabiliation.  Start off with isometrics  and gentle stretching and ROM progressing to a series of concentric and eccentric exercises should be done starting with no weight, work up to 1 lb, hammer, etc.  Use counterforce strap if working  or using hands.  Formal PT would be beneficial if symptoms do not improve. 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  I am going to inject his lateral epicondyle and also give him some oral prednisone to start next week.  Tendon Origin / Insertion Injection Procedure Note Jonathan Choi 28-Jan-1976 Date of procedure: 04/18/2019  Procedure: Tendon Origin / Insertion Injection for Lateral Epicondylitis, L Indications: Pain  Procedure Details Verbal consent was obtained from the patient. Risks, benefits, and alternatives were discussed. Potential complications including loss of pigment, atrophy, and rare risk of infection were discussed. Prepped with Chloraprep and Ethyl Chloride used for anesthesia. Under sterile conditions, the patient was injected at the point of maximal tenderness at the ECRB tendon with 2 cc of Lidocaine 1% and 1 cc of Depo-Medrol 40 mg. Decreased pain after injection. No complications.  Needle size: 22 gauge 1 1/2 inch Medication: Depo-Medrol 40 mg   Follow-up: No follow-ups on file.  Meds ordered this encounter  Medications  . methylPREDNISolone acetate (DEPO-MEDROL) injection 20 mg  . predniSONE (DELTASONE) 20 MG tablet    Sig: 2 tabs po daily for 5 days, then 1 tab po daily for 5 days    Dispense:  15 tablet    Refill:  0   Medications Discontinued During This Encounter  Medication Reason  . hydrocortisone (ANUSOL-HC) 25 MG suppository Completed Course  . Probiotic Product (PROBIOTIC DAILY) CAPS Completed Course     Signed,  Frederico Hamman T. Krisna Omar, MD   Patient's Medications  New Prescriptions   PREDNISONE (DELTASONE) 20 MG TABLET    2 tabs po daily for 5 days, then 1 tab po daily for 5 days  Previous Medications   FENOFIBRATE 160 MG TABLET    TAKE 1 TABLET BY MOUTH DAILY.   MULTIPLE  VITAMIN (MULTIVITAMIN) TABLET    Take 1 tablet by mouth daily.  Modified Medications   No medications on file  Discontinued Medications   HYDROCORTISONE (ANUSOL-HC) 25 MG SUPPOSITORY    Place 1 suppository (25 mg total) rectally 2 (two) times daily.   PROBIOTIC PRODUCT (PROBIOTIC DAILY) CAPS    Take 1 capsule by mouth daily.

## 2019-07-15 ENCOUNTER — Ambulatory Visit: Payer: BC Managed Care – PPO | Admitting: Family Medicine

## 2019-07-15 ENCOUNTER — Encounter: Payer: Self-pay | Admitting: Family Medicine

## 2019-07-15 ENCOUNTER — Other Ambulatory Visit: Payer: Self-pay

## 2019-07-15 VITALS — BP 112/76 | HR 70 | Temp 97.4°F | Ht 69.5 in | Wt 226.0 lb

## 2019-07-15 DIAGNOSIS — H8110 Benign paroxysmal vertigo, unspecified ear: Secondary | ICD-10-CM | POA: Insufficient documentation

## 2019-07-15 DIAGNOSIS — J309 Allergic rhinitis, unspecified: Secondary | ICD-10-CM | POA: Insufficient documentation

## 2019-07-15 DIAGNOSIS — H8113 Benign paroxysmal vertigo, bilateral: Secondary | ICD-10-CM | POA: Diagnosis not present

## 2019-07-15 DIAGNOSIS — J301 Allergic rhinitis due to pollen: Secondary | ICD-10-CM

## 2019-07-15 MED ORDER — MECLIZINE HCL 25 MG PO TABS: 25.0000 mg | ORAL_TABLET | Freq: Three times a day (TID) | ORAL | 0 refills | Status: DC | PRN

## 2019-07-15 MED ORDER — FLUTICASONE PROPIONATE 50 MCG/ACT NA SUSP: 2.0000 | Freq: Every day | NASAL | 6 refills | Status: DC

## 2019-07-15 NOTE — Progress Notes (Signed)
Subjective:    Patient ID: Jonathan Choi, male    DOB: 11/13/1975, 44 y.o.   MRN: [id number]  This visit occurred during the SARS-CoV-2 public health emergency.  Safety protocols were in place, including screening questions prior to the visit, additional usage of staff PPE, and extensive cleaning of exam room while observing appropriate contact time as indicated for disinfecting solutions.    HPI Pt presents for symptoms of dizziness/vertigo   Wt Readings from Last 3 Encounters:  07/15/19 226 lb (102.5 kg)  04/18/19 226 lb (102.5 kg)  12/24/18 217 lb 8 oz (98.7 kg)   32.90 kg/m   Started 2 wk ago  Got out of the shower and leaned over when brushing teeth- raised back up and got dizzy   A week ago Saturday- looked up at a plane and everything started spinning  Last week at work-he bent over and got dizzy at work  This am- brief episode  Gets dizzy when he rolls over in bed   Episodes are brief- 30 seconds to a minute Can make him a little nauseated   Does have allergy symptoms  Nasal and post nasal - after mowing  No ear pain -has had brief ringing in L ear     (did have ear pain a month ago- R side)  No sinus pain   Has a headache now and then-nothing out of the ordinary  No vision changes   No fever  Stays hydrated     BP Readings from Last 3 Encounters:  07/15/19 112/76  04/18/19 130/80  12/24/18 128/72   Pulse Readings from Last 3 Encounters:  07/15/19 70  04/18/19 77  12/24/18 73    Smoking status -no change   Patient Active Problem List   Diagnosis Date Noted  . Benign paroxysmal positional vertigo 07/15/2019  . Umbilical hernia 12/24/2018  . Acute pain of right shoulder 11/06/2018  . Prostate cancer screening 11/30/2017  . Rectal bleeding 07/12/2017  . Elevated glucose 12/06/2016  . Elevated white blood cell count 12/01/2015  . Low libido 10/20/2014  . Hypertriglyceridemia 10/13/2014  . Screening for HIV (human immunodeficiency virus)  10/13/2014  . Routine general medical examination at a health care facility 10/07/2014  . Smoking 04/14/2014  . Obesity (BMI 30-39.9) 04/14/2014   Past Medical History:  Diagnosis Date  . Abrasion of anterior right lower leg 12/22/2015  . Dental crown present   . Ganglion cyst of wrist, left 12/2015  . High triglycerides    Past Surgical History:  Procedure Laterality Date  . KNEE ARTHROSCOPY Right   . MASS EXCISION Left 12/29/2015   Procedure: EXCISION MASS, left wrist;  Surgeon: Betha Loa, MD;  Location: [facility];  Service: Orthopedics;  Laterality: Left;   Social History   Tobacco Use  . Smoking status: Current Every Day Smoker    Packs/day: 0.50    Years: 15.00    Pack years: 7.50    Types: Cigarettes  . Smokeless tobacco: Former Neurosurgeon    Types: Chew    Quit date: 1997  Substance Use Topics  . Alcohol use: No    Alcohol/week: 0.0 standard drinks  . Drug use: No   Family History  Problem Relation Age of Onset  . Hypertension Mother   . Diabetes Father   . Colon polyps Father   . Pneumonia Father   . Lung disease Father   . Stroke Maternal Grandfather   . Heart attack Paternal Grandmother  Allergies  Allergen Reactions  . Cleocin [Clindamycin Hcl] Hives   Current Outpatient Medications on File Prior to Visit  Medication Sig Dispense Refill  . fenofibrate 160 MG tablet TAKE 1 TABLET BY MOUTH DAILY. 90 tablet 1  . Multiple Vitamin (MULTIVITAMIN) tablet Take 1 tablet by mouth daily.     No current facility-administered medications on file prior to visit.     Review of Systems  Constitutional: Negative for activity change, appetite change, fatigue, fever and unexpected weight change.  HENT: Negative for congestion, rhinorrhea, sore throat and trouble swallowing.   Eyes: Negative for pain, redness, itching and visual disturbance.  Respiratory: Negative for cough, chest tightness, shortness of breath and wheezing.   Cardiovascular: Negative  for chest pain and palpitations.  Gastrointestinal: Negative for abdominal pain, blood in stool, constipation, diarrhea and nausea.  Endocrine: Negative for cold intolerance, heat intolerance, polydipsia and polyuria.  Genitourinary: Negative for difficulty urinating, dysuria, frequency and urgency.  Musculoskeletal: Negative for arthralgias, joint swelling and myalgias.  Skin: Negative for pallor and rash.  Neurological: Positive for dizziness. Negative for tremors, seizures, syncope, facial asymmetry, speech difficulty, weakness, light-headedness, numbness and headaches.  Hematological: Negative for adenopathy. Does not bruise/bleed easily.  Psychiatric/Behavioral: Negative for decreased concentration and dysphoric mood. The patient is not nervous/anxious.        Objective:   Physical Exam Constitutional:      General: He is not in acute distress.    Appearance: Normal appearance. He is well-developed. He is obese. He is not ill-appearing or diaphoretic.  HENT:     Head: Normocephalic and atraumatic.     Right Ear: Tympanic membrane, ear canal and external ear normal. There is no impacted cerumen.     Left Ear: Tympanic membrane, ear canal and external ear normal. There is no impacted cerumen.     Nose: Nose normal.     Comments: Boggy nares    Mouth/Throat:     Pharynx: No oropharyngeal exudate.  Eyes:     General: No scleral icterus.       Right eye: No discharge.        Left eye: No discharge.     Conjunctiva/sclera: Conjunctivae normal.     Pupils: Pupils are equal, round, and reactive to light.     Comments: 2-3 beats of horizontal nystagmus bilat  Neck:     Thyroid: No thyromegaly.     Vascular: No carotid bruit or JVD.     Trachea: No tracheal deviation.  Cardiovascular:     Rate and Rhythm: Normal rate and regular rhythm.     Heart sounds: Normal heart sounds. No murmur.  Pulmonary:     Effort: Pulmonary effort is normal. No respiratory distress.     Breath sounds:  Normal breath sounds. No wheezing or rales.  Abdominal:     General: Bowel sounds are normal. There is no distension.     Palpations: Abdomen is soft. There is no mass.     Tenderness: There is no abdominal tenderness.  Musculoskeletal:        General: No tenderness.     Cervical back: Full passive range of motion without pain, normal range of motion and neck supple.  Lymphadenopathy:     Cervical: No cervical adenopathy.  Skin:    General: Skin is warm and dry.     Coloration: Skin is not pale.     Findings: No rash.  Neurological:     Mental Status: He is alert and oriented  to person, place, and time.     Cranial Nerves: No cranial nerve deficit or facial asymmetry.     Sensory: No sensory deficit.     Motor: No weakness, tremor, atrophy, abnormal muscle tone, seizure activity or pronator drift.     Coordination: Coordination is intact. Romberg sign negative. Coordination normal. Finger-Nose-Finger Test normal.     Gait: Gait normal.     Deep Tendon Reflexes: Reflexes are normal and symmetric. Reflexes normal.     Comments: No focal cerebellar signs  Nl gait   Gets dizzy with lateral and upward gaze (mild)  Psychiatric:        Behavior: Behavior normal.        Thought Content: Thought content normal.           Assessment & Plan:   Problem List Items Addressed This Visit      Respiratory   Allergic rhinitis    Worst when mowing and working outdoors May be causing vertigo  Give px for flonase ns to use daily through season Adv mask with yardwork and mowing        Nervous and Auditory   Benign paroxysmal positional vertigo - Primary    With some seasonal allergy symptoms noted  May be middle ear related Nl neuro exam (does have nystagmus)  Px meclizine for use prn when not working or driving (try at night) Handout on vertigo and Epley  maneuver given to try  Also flonase- to use through allergy season  inst to call if worse or new symptoms or neuro changes  If  no imp over the next week would consider ENT ref

## 2019-07-15 NOTE — Assessment & Plan Note (Signed)
Worst when mowing and working outdoors May be causing vertigo  Give px for flonase ns to use daily through season Adv mask with yardwork and mowing

## 2019-07-15 NOTE — Assessment & Plan Note (Signed)
With some seasonal allergy symptoms noted  May be middle ear related Nl neuro exam (does have nystagmus)  Px meclizine for use prn when not working or driving (try at night) Handout on vertigo and Epley  maneuver given to try  Also flonase- to use through allergy season  inst to call if worse or new symptoms or neuro changes  If no imp over the next week would consider ENT ref

## 2019-07-15 NOTE — Patient Instructions (Addendum)
I think you may have vertigo from inner ear problems (possibly due to allergies)   Start using flonase during the allergy season once daily   Meclizine as needed- (sedation is possibly- start with night time)   Take a look at the handout re: the maneuver to help  Stay hydrated Do not change position quickly   If not starting to improve in the next week- let us know   If symptoms change or worsen let us know

## 2019-07-16 ENCOUNTER — Encounter: Payer: Self-pay | Admitting: Family Medicine

## 2019-10-09 ENCOUNTER — Encounter: Payer: Self-pay | Admitting: Family Medicine

## 2019-11-12 ENCOUNTER — Other Ambulatory Visit: Payer: Self-pay

## 2019-11-12 ENCOUNTER — Other Ambulatory Visit: Payer: BC Managed Care – PPO

## 2019-11-12 DIAGNOSIS — Z20822 Contact with and (suspected) exposure to covid-19: Secondary | ICD-10-CM

## 2019-11-13 LAB — SARS-COV-2, NAA 2 DAY TAT

## 2019-11-13 LAB — NOVEL CORONAVIRUS, NAA: SARS-CoV-2, NAA: NOT DETECTED

## 2019-11-16 ENCOUNTER — Ambulatory Visit: Payer: BC Managed Care – PPO

## 2019-11-18 DIAGNOSIS — Z03818 Encounter for observation for suspected exposure to other biological agents ruled out: Secondary | ICD-10-CM | POA: Diagnosis not present

## 2019-11-18 DIAGNOSIS — Z20822 Contact with and (suspected) exposure to covid-19: Secondary | ICD-10-CM | POA: Diagnosis not present

## 2019-12-09 ENCOUNTER — Other Ambulatory Visit: Payer: Self-pay | Admitting: Family Medicine

## 2019-12-23 ENCOUNTER — Telehealth: Payer: Self-pay | Admitting: Family Medicine

## 2019-12-23 DIAGNOSIS — Z125 Encounter for screening for malignant neoplasm of prostate: Secondary | ICD-10-CM

## 2019-12-23 DIAGNOSIS — E781 Pure hyperglyceridemia: Secondary | ICD-10-CM

## 2019-12-23 DIAGNOSIS — Z0000 Encounter for general adult medical examination without abnormal findings: Secondary | ICD-10-CM

## 2019-12-23 DIAGNOSIS — R7309 Other abnormal glucose: Secondary | ICD-10-CM

## 2019-12-23 NOTE — Telephone Encounter (Signed)
-----   Message from Alvina Chou sent at 12/10/2019  2:35 PM EDT ----- Regarding: Lab orders for Tuesday, 10.5.21 Patient is scheduled for CPX labs, please order future labs, Thanks , Camelia Eng

## 2019-12-24 ENCOUNTER — Other Ambulatory Visit (INDEPENDENT_AMBULATORY_CARE_PROVIDER_SITE_OTHER): Payer: BC Managed Care – PPO

## 2019-12-24 ENCOUNTER — Other Ambulatory Visit: Payer: Self-pay

## 2019-12-24 DIAGNOSIS — E781 Pure hyperglyceridemia: Secondary | ICD-10-CM | POA: Diagnosis not present

## 2019-12-24 DIAGNOSIS — Z125 Encounter for screening for malignant neoplasm of prostate: Secondary | ICD-10-CM | POA: Diagnosis not present

## 2019-12-24 DIAGNOSIS — Z0000 Encounter for general adult medical examination without abnormal findings: Secondary | ICD-10-CM | POA: Diagnosis not present

## 2019-12-24 DIAGNOSIS — R7309 Other abnormal glucose: Secondary | ICD-10-CM | POA: Diagnosis not present

## 2019-12-24 LAB — LIPID PANEL
Cholesterol: 187 mg/dL (ref 0–200)
HDL: 30.1 mg/dL — ABNORMAL LOW (ref >39.00)
NonHDL: 157
Total CHOL/HDL Ratio: 6
Triglycerides: 220 mg/dL — ABNORMAL HIGH (ref 0.0–149.0)
VLDL: 44 mg/dL — ABNORMAL HIGH (ref 0.0–40.0)

## 2019-12-24 LAB — COMPREHENSIVE METABOLIC PANEL
ALT: 29 U/L (ref 0–53)
AST: 15 U/L (ref 0–37)
Albumin: 4.2 g/dL (ref 3.5–5.2)
Alkaline Phosphatase: 72 U/L (ref 39–117)
BUN: 17 mg/dL (ref 6–23)
CO2: 27 mEq/L (ref 19–32)
Calcium: 9.7 mg/dL (ref 8.4–10.5)
Chloride: 111 mEq/L (ref 96–112)
Creatinine, Ser: 1.14 mg/dL (ref 0.40–1.50)
GFR: 78.24 mL/min (ref >60.00)
Glucose, Bld: 96 mg/dL (ref 70–99)
Potassium: 4.6 mEq/L (ref 3.5–5.1)
Sodium: 143 mEq/L (ref 135–145)
Total Bilirubin: 0.2 mg/dL (ref 0.2–1.2)
Total Protein: 6.7 g/dL (ref 6.0–8.3)

## 2019-12-24 LAB — LDL CHOLESTEROL, DIRECT: Direct LDL: 135 mg/dL

## 2019-12-24 LAB — TSH: TSH: 1.54 u[IU]/mL (ref 0.35–4.50)

## 2019-12-24 LAB — CBC WITH DIFFERENTIAL/PLATELET
Basophils Absolute: 0.1 10*3/uL (ref 0.0–0.1)
Basophils Relative: 0.7 % (ref 0.0–3.0)
Eosinophils Absolute: 0.2 10*3/uL (ref 0.0–0.7)
Eosinophils Relative: 2.1 % (ref 0.0–5.0)
HCT: 41.5 % (ref 39.0–52.0)
Hemoglobin: 13.8 g/dL (ref 13.0–17.0)
Lymphocytes Relative: 29.2 % (ref 12.0–46.0)
Lymphs Abs: 3.4 10*3/uL (ref 0.7–4.0)
MCHC: 33.4 g/dL (ref 30.0–36.0)
MCV: 87.6 fl (ref 78.0–100.0)
Monocytes Absolute: 1 10*3/uL (ref 0.1–1.0)
Monocytes Relative: 8.7 % (ref 3.0–12.0)
Neutro Abs: 6.9 10*3/uL (ref 1.4–7.7)
Neutrophils Relative %: 59.3 % (ref 43.0–77.0)
Platelets: 294 10*3/uL (ref 150.0–400.0)
RBC: 4.73 Mil/uL (ref 4.22–5.81)
RDW: 14 % (ref 11.5–15.5)
WBC: 11.7 10*3/uL — ABNORMAL HIGH (ref 4.0–10.5)

## 2019-12-24 LAB — PSA: PSA: 0.15 ng/mL (ref 0.10–4.00)

## 2019-12-24 LAB — HEMOGLOBIN A1C: Hgb A1c MFr Bld: 6.2 % (ref 4.6–6.5)

## 2019-12-26 ENCOUNTER — Encounter: Payer: Self-pay | Admitting: Family Medicine

## 2019-12-26 ENCOUNTER — Ambulatory Visit (INDEPENDENT_AMBULATORY_CARE_PROVIDER_SITE_OTHER): Payer: BC Managed Care – PPO | Admitting: Family Medicine

## 2019-12-26 ENCOUNTER — Other Ambulatory Visit: Payer: Self-pay | Admitting: Family Medicine

## 2019-12-26 ENCOUNTER — Other Ambulatory Visit: Payer: Self-pay

## 2019-12-26 VITALS — BP 116/78 | HR 73 | Temp 96.4°F | Ht 69.25 in | Wt 228.6 lb

## 2019-12-26 DIAGNOSIS — E669 Obesity, unspecified: Secondary | ICD-10-CM | POA: Diagnosis not present

## 2019-12-26 DIAGNOSIS — Z0000 Encounter for general adult medical examination without abnormal findings: Secondary | ICD-10-CM

## 2019-12-26 DIAGNOSIS — E781 Pure hyperglyceridemia: Secondary | ICD-10-CM

## 2019-12-26 DIAGNOSIS — Z125 Encounter for screening for malignant neoplasm of prostate: Secondary | ICD-10-CM

## 2019-12-26 DIAGNOSIS — R7303 Prediabetes: Secondary | ICD-10-CM | POA: Diagnosis not present

## 2019-12-26 DIAGNOSIS — D72829 Elevated white blood cell count, unspecified: Secondary | ICD-10-CM

## 2019-12-26 DIAGNOSIS — Z23 Encounter for immunization: Secondary | ICD-10-CM

## 2019-12-26 DIAGNOSIS — F17200 Nicotine dependence, unspecified, uncomplicated: Secondary | ICD-10-CM | POA: Insufficient documentation

## 2019-12-26 DIAGNOSIS — Z1159 Encounter for screening for other viral diseases: Secondary | ICD-10-CM

## 2019-12-26 MED ORDER — FENOFIBRATE 160 MG PO TABS
160.0000 mg | ORAL_TABLET | Freq: Every day | ORAL | 3 refills | Status: DC
Start: 2019-12-26 — End: 2019-12-26

## 2019-12-26 NOTE — Assessment & Plan Note (Addendum)
Lab Results  Component Value Date   HGBA1C 6.2 12/24/2019   disc imp of low glycemic diet and wt loss to prevent DM2  Plan to get rid of sweet drinks if possible  Will re check in 6 mo  Info given re: condition and diet

## 2019-12-26 NOTE — Assessment & Plan Note (Signed)
Lab Results  Component Value Date   PSA 0.15 12/24/2019   PSA 0.16 12/06/2017    No symptoms  No fam hx

## 2019-12-26 NOTE — Assessment & Plan Note (Signed)
Will check screen with 6 mo labs

## 2019-12-26 NOTE — Assessment & Plan Note (Signed)
Disc in detail risks of smoking and possible outcomes including copd, vascular/ heart disease, cancer , respiratory and sinus infections  Pt voices understanding Pt is not ready to quit right now but thinking about it and likely will in the future  No sob or wheezing

## 2019-12-26 NOTE — Assessment & Plan Note (Signed)
Mild  No symptoms  Suspect due to smoking Will continue to monitor

## 2019-12-26 NOTE — Assessment & Plan Note (Signed)
Discussed how this problem influences overall health and the risks it imposes  Reviewed plan for weight loss with lower calorie diet (via better food choices and also portion control or program like weight watchers) and exercise building up to or more than 30 minutes 5 days per week including some aerobic activity    

## 2019-12-26 NOTE — Assessment & Plan Note (Signed)
Stable, fair control with fenofibrate  Disc goals for lipids and reasons to control them Rev last labs with pt Rev low sat fat diet in detail  LDL not at goal-again disc diet  Hesitant to add statin but may have to in the future Risks of CV dz higher due to smoking

## 2019-12-26 NOTE — Patient Instructions (Addendum)
Try to get most of your carbohydrates from produce (with the exception of white potatoes)  Eat less bread/pasta/rice/snack foods/cereals/sweets and other items from the middle of the grocery store (processed carbs)  Cut the sweet drinks - soda / power drinks  -this would really help   Let's re check your sugar/a1c in 6 months  Will also screen for hep C then  Stop at check out and schedule a lab appt   Keep thinking about quitting smoking   Flu shot today

## 2019-12-26 NOTE — Assessment & Plan Note (Signed)
Reviewed health habits including diet and exercise and skin cancer prevention Reviewed appropriate screening tests for age  Also reviewed health mt list, fam hx and immunization status , as well as social and family history   See HPI Labs reviewed Flu shot given  Declines covid imm/ wears mask and is careful  No prostate problems or hx   Lab Results  Component Value Date   PSA 0.15 12/24/2019   PSA 0.16 12/06/2017  will do hep C screening at next lab appt in 6 mo  Disc need for wt loss with prediabetes

## 2019-12-26 NOTE — Progress Notes (Signed)
Subjective:    Patient ID: Jonathan Choi, male    DOB: 1975/04/24, 44 y.o.   MRN: [id number]  This visit occurred during the SARS-CoV-2 public health emergency.  Safety protocols were in place, including screening questions prior to the visit, additional usage of staff PPE, and extensive cleaning of exam room while observing appropriate contact time as indicated for disinfecting solutions.    HPI Here for health maintenance exam and to review chronic medical problems   Wt Readings from Last 3 Encounters:  12/26/19 228 lb 9 oz (103.7 kg)  07/15/19 226 lb (102.5 kg)  04/18/19 226 lb (102.5 kg)   33.51 kg/m  Fighting tennis elbow   Right index finger- difficult to bend first thing in the am/ ? Triggers    Trying to take care of himself  Job is physical- lot of walking  No time to exercise   Diet has not changed  Is a boredom eater - tries to stay busy   Flu shot today  covid status -not vaccinated   Hep C screening   Prostate health  Lab Results  Component Value Date   PSA 0.15 12/24/2019   PSA 0.16 12/06/2017  no nocturia unless he drinks a lot right before bed  No prostate cancer in the family    BP Readings from Last 3 Encounters:  12/26/19 116/78  07/15/19 112/76  04/18/19 130/80   Pulse Readings from Last 3 Encounters:  12/26/19 73  07/15/19 70  04/18/19 77      Hypertriglyceridemia  Taking fenofibrate 160 mg daily  Lab Results  Component Value Date   CHOL 187 12/24/2019   CHOL 208 (H) 12/18/2018   CHOL 205 (H) 12/06/2017   Lab Results  Component Value Date   HDL 30.10 (L) 12/24/2019   HDL 34.30 (L) 12/18/2018   HDL 28.10 (L) 12/06/2017   Lab Results  Component Value Date   LDLCALC 129 (H) 11/30/2015   LDLCALC 137 (H) 01/16/2015   Lab Results  Component Value Date   TRIG 220.0 (H) 12/24/2019   TRIG 285.0 (H) 12/18/2018   TRIG 257.0 (H) 12/06/2017   Lab Results  Component Value Date   CHOLHDL 6 12/24/2019   CHOLHDL 6 12/18/2018    CHOLHDL 7 12/06/2017   Lab Results  Component Value Date   LDLDIRECT 135.0 12/24/2019   LDLDIRECT 143.0 12/18/2018   LDLDIRECT 140.0 12/06/2017  does not eat fast food  Tries to cook at home    Past elevated glucose level Lab Results  Component Value Date   HGBA1C 6.2 12/24/2019  fasting glucose of 96 Diabetes does not run in family  Loves pasta  Does not eat sweets daily /likes ice cream  Does drink mt dew 1-2 at work (20 oz bottles)  Also gatorade/power aide    Mildly elevated wbc in the past  Lab Results  Component Value Date   WBC 11.7 (H) 12/24/2019   HGB 13.8 12/24/2019   HCT 41.5 12/24/2019   MCV 87.6 12/24/2019   PLT 294.0 12/24/2019   No symptoms of infection  No recent steroid   Smoking is the same - 1/2 ppd or a little more (better if busy)  Would love to eventually quit smoking   Patient Active Problem List   Diagnosis Date Noted  . Smoker 12/26/2019  . Encounter for hepatitis C screening test for low risk patient 12/26/2019  . Benign paroxysmal positional vertigo 07/15/2019  . Allergic rhinitis 07/15/2019  . Umbilical hernia  12/24/2018  . Acute pain of right shoulder 11/06/2018  . Prostate cancer screening 11/30/2017  . Prediabetes 12/06/2016  . Elevated white blood cell count 12/01/2015  . Low libido 10/20/2014  . Hypertriglyceridemia 10/13/2014  . Screening for HIV (human immunodeficiency virus) 10/13/2014  . Routine general medical examination at a health care facility 10/07/2014  . Smoking 04/14/2014  . Obesity (BMI 30-39.9) 04/14/2014   Past Medical History:  Diagnosis Date  . Abrasion of anterior right lower leg 12/22/2015  . Dental crown present   . Ganglion cyst of wrist, left 12/2015  . High triglycerides    Past Surgical History:  Procedure Laterality Date  . KNEE ARTHROSCOPY Right   . MASS EXCISION Left 12/29/2015   Procedure: EXCISION MASS, left wrist;  Surgeon: Betha Loa, MD;  Location: [facility];   Service: Orthopedics;  Laterality: Left;   Social History   Tobacco Use  . Smoking status: Current Every Day Smoker    Packs/day: 0.50    Years: 15.00    Pack years: 7.50    Types: Cigarettes  . Smokeless tobacco: Former Neurosurgeon    Types: Chew    Quit date: 1997  Advertising account planner  . Vaping Use: Never used  Substance Use Topics  . Alcohol use: No    Alcohol/week: 0.0 standard drinks  . Drug use: No   Family History  Problem Relation Age of Onset  . Hypertension Mother   . Diabetes Father   . Colon polyps Father   . Pneumonia Father   . Lung disease Father   . Stroke Maternal Grandfather   . Heart attack Paternal Grandmother    Allergies  Allergen Reactions  . Cleocin [Clindamycin Hcl] Hives   Current Outpatient Medications on File Prior to Visit  Medication Sig Dispense Refill  . Ibuprofen (ADVIL PO) Take by mouth as needed.    . Multiple Vitamin (MULTIVITAMIN) tablet Take 1 tablet by mouth daily.    . fluticasone (FLONASE) 50 MCG/ACT nasal spray Place 2 sprays into both nostrils daily. (Patient not taking: Reported on 12/26/2019) 16 g 6   No current facility-administered medications on file prior to visit.    Review of Systems  Constitutional: Negative for activity change, appetite change, fatigue, fever and unexpected weight change.  HENT: Negative for congestion, rhinorrhea, sore throat and trouble swallowing.   Eyes: Negative for pain, redness, itching and visual disturbance.  Respiratory: Negative for cough, chest tightness, shortness of breath and wheezing.   Cardiovascular: Negative for chest pain and palpitations.  Gastrointestinal: Negative for abdominal pain, blood in stool, constipation, diarrhea and nausea.       Umbilical hernia  Endocrine: Negative for cold intolerance, heat intolerance, polydipsia and polyuria.  Genitourinary: Negative for difficulty urinating, dysuria, frequency and urgency.  Musculoskeletal: Negative for arthralgias, joint swelling and  myalgias.  Skin: Negative for pallor and rash.  Neurological: Negative for dizziness, tremors, weakness, numbness and headaches.  Hematological: Negative for adenopathy. Does not bruise/bleed easily.  Psychiatric/Behavioral: Negative for decreased concentration and dysphoric mood. The patient is not nervous/anxious.        Objective:   Physical Exam Constitutional:      General: He is not in acute distress.    Appearance: Normal appearance. He is well-developed. He is obese. He is not ill-appearing or diaphoretic.  HENT:     Head: Normocephalic and atraumatic.     Right Ear: Tympanic membrane, ear canal and external ear normal.     Left Ear: Tympanic  membrane, ear canal and external ear normal.     Nose: Nose normal. No congestion.     Mouth/Throat:     Mouth: Mucous membranes are moist.     Pharynx: Oropharynx is clear. No posterior oropharyngeal erythema.  Eyes:     General: No scleral icterus.       Right eye: No discharge.        Left eye: No discharge.     Conjunctiva/sclera: Conjunctivae normal.     Pupils: Pupils are equal, round, and reactive to light.  Neck:     Thyroid: No thyromegaly.     Vascular: No carotid bruit or JVD.  Cardiovascular:     Rate and Rhythm: Normal rate and regular rhythm.     Pulses: Normal pulses.     Heart sounds: Normal heart sounds. No gallop.   Pulmonary:     Effort: Pulmonary effort is normal. No respiratory distress.     Breath sounds: Normal breath sounds. No wheezing or rales.     Comments: Good air exch, nl breath sounds Chest:     Chest wall: No tenderness.  Abdominal:     General: Bowel sounds are normal. There is no distension or abdominal bruit.     Palpations: Abdomen is soft. There is no mass.     Tenderness: There is no abdominal tenderness.     Hernia: No hernia is present.  Musculoskeletal:        General: No tenderness.     Cervical back: Normal range of motion and neck supple. No rigidity. No muscular tenderness.      Right lower leg: No edema.     Left lower leg: No edema.  Lymphadenopathy:     Cervical: No cervical adenopathy.  Skin:    General: Skin is warm and dry.     Coloration: Skin is not pale.     Findings: No erythema or rash.     Comments: Solar lentigines    Neurological:     Mental Status: He is alert.     Cranial Nerves: No cranial nerve deficit.     Motor: No abnormal muscle tone.     Coordination: Coordination normal.     Gait: Gait normal.     Deep Tendon Reflexes: Reflexes are normal and symmetric. Reflexes normal.  Psychiatric:        Mood and Affect: Mood normal.        Cognition and Memory: Cognition and memory normal.           Assessment & Plan:   Problem List Items Addressed This Visit      Other   Smoking    Disc in detail risks of smoking and possible outcomes including copd, vascular/ heart disease, cancer , respiratory and sinus infections  Pt voices understanding Pt is not ready to quit right now but thinking about it and likely will in the future  No sob or wheezing      Obesity (BMI 30-39.9)    Discussed how this problem influences overall health and the risks it imposes  Reviewed plan for weight loss with lower calorie diet (via better food choices and also portion control or program like weight watchers) and exercise building up to or more than 30 minutes 5 days per week including some aerobic activity         Routine general medical examination at a health care facility - Primary    Reviewed health habits including diet and exercise and skin cancer  prevention Reviewed appropriate screening tests for age  Also reviewed health mt list, fam hx and immunization status , as well as social and family history   See HPI Labs reviewed Flu shot given  Declines covid imm/ wears mask and is careful  No prostate problems or hx   Lab Results  Component Value Date   PSA 0.15 12/24/2019   PSA 0.16 12/06/2017  will do hep C screening at next lab appt in 6  mo  Disc need for wt loss with prediabetes          Hypertriglyceridemia    Stable, fair control with fenofibrate  Disc goals for lipids and reasons to control them Rev last labs with pt Rev low sat fat diet in detail  LDL not at goal-again disc diet  Hesitant to add statin but may have to in the future Risks of CV dz higher due to smoking      Relevant Medications   fenofibrate 160 MG tablet   Elevated white blood cell count    Mild  No symptoms  Suspect due to smoking Will continue to monitor      Prediabetes    Lab Results  Component Value Date   HGBA1C 6.2 12/24/2019   disc imp of low glycemic diet and wt loss to prevent DM2  Plan to get rid of sweet drinks if possible  Will re check in 6 mo  Info given re: condition and diet       Relevant Orders   Hemoglobin A1c   Prostate cancer screening    Lab Results  Component Value Date   PSA 0.15 12/24/2019   PSA 0.16 12/06/2017    No symptoms  No fam hx      Encounter for hepatitis C screening test for low risk patient    Will check screen with 6 mo labs       Relevant Orders   Hepatitis C antibody

## 2020-03-09 DIAGNOSIS — Z20822 Contact with and (suspected) exposure to covid-19: Secondary | ICD-10-CM | POA: Diagnosis not present

## 2020-05-25 ENCOUNTER — Ambulatory Visit (INDEPENDENT_AMBULATORY_CARE_PROVIDER_SITE_OTHER)
Admission: RE | Admit: 2020-05-25 | Discharge: 2020-05-25 | Disposition: A | Discharge status: Home / Self Care | Payer: BC Managed Care – PPO | Source: Ambulatory Visit | Attending: Family Medicine | Admitting: Family Medicine

## 2020-05-25 ENCOUNTER — Ambulatory Visit: Payer: BC Managed Care – PPO | Admitting: Family Medicine

## 2020-05-25 ENCOUNTER — Encounter: Payer: Self-pay | Admitting: Family Medicine

## 2020-05-25 ENCOUNTER — Other Ambulatory Visit: Payer: Self-pay

## 2020-05-25 DIAGNOSIS — M25561 Pain in right knee: Secondary | ICD-10-CM

## 2020-05-25 NOTE — Patient Instructions (Signed)
You can try voltaren gel 1% over the counter topically four times daily    (or oral ibuprofen)  Use ice and elevate when you can  Wear the compression sleeve if it helps   Avoid heavy impact activities for the meantime   Xray now  We will make a plan from there

## 2020-05-25 NOTE — Progress Notes (Signed)
Subjective:    Patient ID: Jonathan Choi, male    DOB: [birth year], 45 y.o.   MRN: [id number]  This visit occurred during the SARS-CoV-2 public health emergency.  Safety protocols were in place, including screening questions prior to the visit, additional usage of staff PPE, and extensive cleaning of exam room while observing appropriate contact time as indicated for disinfecting solutions.    HPI Pt presents with R knee pain   Wt Readings from Last 3 Encounters:  05/25/20 218 lb 9 oz (99.1 kg)  12/26/19 228 lb 9 oz (103.7 kg)  07/15/19 226 lb (102.5 kg)   32.04 kg/m   Last Monday was playing BB with kid's team  No injury known  Sore ever since Med/lat and just a little behind Walking- on lateral side/pops Driving - medial  Can walk on it   Monday night knee was a little swollen so used ice Cannot tell now    Plays golf on weekends   Ibuprofen - 600 mg tid  Used ice for a few days    He has had a knee arthroscopy in the past  Torn lateral meniscus  Non eventful  Uses caution since then  He has used a knee brace in the past week (compression)   Patient Active Problem List   Diagnosis Date Noted  . Right knee pain 05/25/2020  . Smoker 12/26/2019  . Encounter for hepatitis C screening test for low risk patient 12/26/2019  . Benign paroxysmal positional vertigo 07/15/2019  . Allergic rhinitis 07/15/2019  . Umbilical hernia 12/24/2018  . Acute pain of right shoulder 11/06/2018  . Prostate cancer screening 11/30/2017  . Prediabetes 12/06/2016  . Elevated white blood cell count 12/01/2015  . Low libido 10/20/2014  . Hypertriglyceridemia 10/13/2014  . Screening for HIV (human immunodeficiency virus) 10/13/2014  . Routine general medical examination at a health care facility 10/07/2014  . Smoking 04/14/2014  . Obesity (BMI 30-39.9) 04/14/2014   Past Medical History:  Diagnosis Date  . Abrasion of anterior right lower leg 12/22/2015  . Dental crown present    . Ganglion cyst of wrist, left 12/2015  . High triglycerides    Past Surgical History:  Procedure Laterality Date  . KNEE ARTHROSCOPY Right   . MASS EXCISION Left 12/29/2015   Procedure: EXCISION MASS, left wrist;  Surgeon: Betha Loa, MD;  Location: [facility];  Service: Orthopedics;  Laterality: Left;   Social History   Tobacco Use  . Smoking status: Current Every Day Smoker    Packs/day: 0.50    Years: 15.00    Pack years: 7.50    Types: Cigarettes  . Smokeless tobacco: Former Neurosurgeon    Types: Chew    Quit date: 1997  Advertising account planner  . Vaping Use: Never used  Substance Use Topics  . Alcohol use: No    Alcohol/week: 0.0 standard drinks  . Drug use: No   Family History  Problem Relation Age of Onset  . Hypertension Mother   . Diabetes Father   . Colon polyps Father   . Pneumonia Father   . Lung disease Father   . Stroke Maternal Grandfather   . Heart attack Paternal Grandmother    Allergies  Allergen Reactions  . Cleocin [Clindamycin Hcl] Hives   Current Outpatient Medications on File Prior to Visit  Medication Sig Dispense Refill  . fenofibrate 160 MG tablet Take 1 tablet (160 mg total) by mouth daily. 90 tablet 3  . Ibuprofen (ADVIL  PO) Take by mouth as needed.    . Multiple Vitamin (MULTIVITAMIN) tablet Take 1 tablet by mouth daily.     No current facility-administered medications on file prior to visit.    Review of Systems  Constitutional: Negative for activity change, appetite change, fatigue, fever and unexpected weight change.  HENT: Negative for congestion, rhinorrhea, sore throat and trouble swallowing.   Eyes: Negative for pain, redness, itching and visual disturbance.  Respiratory: Negative for cough, chest tightness, shortness of breath and wheezing.   Cardiovascular: Negative for chest pain and palpitations.  Gastrointestinal: Negative for abdominal pain, blood in stool, constipation, diarrhea and nausea.  Endocrine: Negative for cold  intolerance, heat intolerance, polydipsia and polyuria.  Genitourinary: Negative for difficulty urinating, dysuria, frequency and urgency.  Musculoskeletal: Positive for arthralgias. Negative for joint swelling and myalgias.       Right knee pain   Skin: Negative for pallor and rash.  Neurological: Negative for dizziness, tremors, weakness, numbness and headaches.  Hematological: Negative for adenopathy. Does not bruise/bleed easily.  Psychiatric/Behavioral: Negative for decreased concentration and dysphoric mood. The patient is not nervous/anxious.        Objective:   Physical Exam Constitutional:      General: He is not in acute distress.    Appearance: Normal appearance. He is obese. He is not ill-appearing.  Musculoskeletal:     Comments: Right knee  Small effusion likely No warmth to the touch  No crepitus  ROM:  Flex 100 deg Ext full mcmurray positive for lateral pain  Bounce test negative   Stability: Anterior drawer-nl Lachman exam -nl  Tenderness medial and lateral joint line   Gait guarded    Neurological:     Mental Status: He is alert.           Assessment & Plan:   Problem List Items Addressed This Visit      Other   Right knee pain    With past h/o meniscal surgery  Med/lat pain and effusion after playing BB (no trauma however)  Recommend nsaid (oral or topical) and ice/elevation and compression  Avoid high impact activities until improved Poss OA based on past h/o surgery  Xray now and plan to follow      Relevant Orders   DG Knee 4 Views W/Patella Right (Completed)

## 2020-05-25 NOTE — Assessment & Plan Note (Signed)
With past h/o meniscal surgery  Med/lat pain and effusion after playing BB (no trauma however)  Recommend nsaid (oral or topical) and ice/elevation and compression  Avoid high impact activities until improved Poss OA based on past h/o surgery  Xray now and plan to follow

## 2020-05-28 ENCOUNTER — Other Ambulatory Visit: Payer: Self-pay

## 2020-05-28 ENCOUNTER — Ambulatory Visit: Payer: BC Managed Care – PPO | Admitting: Family Medicine

## 2020-05-28 ENCOUNTER — Encounter: Payer: Self-pay | Admitting: Family Medicine

## 2020-05-28 VITALS — BP 104/72 | HR 67 | Temp 97.4°F | Ht 69.25 in | Wt 218.2 lb

## 2020-05-28 DIAGNOSIS — M6751 Plica syndrome, right knee: Secondary | ICD-10-CM

## 2020-05-28 NOTE — Progress Notes (Signed)
Jonathan Danh T. Jonathan Bosak, MD, CAQ Sports Medicine  Primary Care and Sports Medicine [facility] [address] [place], [address]  Phone: [phone]  FAX: [phone]  LOTTIE SISKA - 45 y.o. male  MRN [id number]  Date of Birth: 1975/06/22  Date: 05/28/2020  PCP: Judy Pimple, MD  Referral: Judy Pimple, MD  Chief Complaint  Patient presents with  . Knee Pain    Right    This visit occurred during the SARS-CoV-2 public health emergency.  Safety protocols were in place, including screening questions prior to the visit, additional usage of staff PPE, and extensive cleaning of exam room while observing appropriate contact time as indicated for disinfecting solutions.   Subjective:   Jonathan Choi is a 45 y.o. very pleasant male patient with Body mass index is 32 kg/m. who presents with the following:  Around the first of the month, knee started to hurt after his son's basketball game.    Never felt any sharp pain, and the next day his knee was sore.  Medial and laterally.   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   At this point he does have some pain with a very deep flexion, but his knee has improved some with pain.  He does have predominantly pain laterally caudal to the joint line.  This is also just lateral to the kneecap.  He does not have any mechanical locking up or functional giving way.  R knee arthroscopy.  Dr. Lajoyce Corners patient.     Review of Systems is noted in the HPI, as appropriate   Objective:   BP 104/72   Pulse 67   Temp (!) 97.4 F (36.3 C) (Temporal)   Ht 5' 9.25" (1.759 m)   Wt 218 lb 4 oz (99 kg)   SpO2 96%   BMI 32.00 kg/m   Right knee: Minimal effusion.  Nontender with loading the medial lateral patellar facets, and he does not have any pain on the joint lines at all.  Stable to varus and valgus stress.  Lachman and drawer testing are all negative.  No pain or dysfunction with McMurray's testing,  flexion pinch testing, or bounce home testing.  He does have a palpable plica laterally and this is where he points to his maximal pain.  Radiology: DG Knee 4 Views W/Patella Right  Result Date: 05/25/2020 CLINICAL DATA:  Right knee pain. EXAM: RIGHT KNEE - COMPLETE 4+ VIEW COMPARISON:  October 09, 2007 FINDINGS: No evidence of acute fracture or dislocation. No evidence of arthropathy or other focal bone abnormality. A small joint effusion is noted. IMPRESSION: 1. No acute osseous abnormality. 2. Small joint effusion. Electronically Signed   By: Aram Candela M.D.   On: 05/25/2020 11:10    Assessment and Plan:     ICD-10-CM   1. Plica of knee, right  M67.51    I would continue with conservative care, and I reassured him that I do not think that there is anything major going on with his knee.  Patient Instructions  PLICA BAND or PLICA on the knee  Band of tissue / Kink in the synovial lining. The joint lining.  Use your fingers a few times a day for the next few weeks to massage that area to help break down the tissue. You can also use an ice cube.   Voltaren 1% gel. Over the counter You can apply up to 4 times a day Minimal is  absorbed in the bloodstream Cost is about 9 dollars     Signed,  Karleen Hampshire T. Tekoa Amon, MD   Outpatient Encounter Medications as of 05/28/2020  Medication Sig  . fenofibrate 160 MG tablet Take 1 tablet (160 mg total) by mouth daily.  . Ibuprofen (ADVIL PO) Take by mouth as needed.  . Multiple Vitamin (MULTIVITAMIN) tablet Take 1 tablet by mouth daily.   No facility-administered encounter medications on file as of 05/28/2020.

## 2020-05-28 NOTE — Patient Instructions (Signed)
PLICA BAND or PLICA on the knee  Band of tissue / Kink in the synovial lining. The joint lining.  Use your fingers a few times a day for the next few weeks to massage that area to help break down the tissue. You can also use an ice cube.   Voltaren 1% gel. Over the counter You can apply up to 4 times a day Minimal is absorbed in the bloodstream Cost is about 9 dollars

## 2020-06-25 ENCOUNTER — Other Ambulatory Visit: Payer: Self-pay

## 2020-06-25 ENCOUNTER — Other Ambulatory Visit (INDEPENDENT_AMBULATORY_CARE_PROVIDER_SITE_OTHER): Payer: BC Managed Care – PPO

## 2020-06-25 DIAGNOSIS — Z1159 Encounter for screening for other viral diseases: Secondary | ICD-10-CM

## 2020-06-25 DIAGNOSIS — R7303 Prediabetes: Secondary | ICD-10-CM

## 2020-06-25 LAB — HEMOGLOBIN A1C: Hgb A1c MFr Bld: 6.1 % (ref 4.6–6.5)

## 2020-06-26 LAB — HEPATITIS C ANTIBODY
Hepatitis C Ab: NONREACTIVE
SIGNAL TO CUT-OFF: 0.02 (ref <1.00)

## 2020-07-06 ENCOUNTER — Other Ambulatory Visit: Payer: Self-pay

## 2020-07-06 MED FILL — Fenofibrate Tab 160 MG: ORAL | 90 days supply | Qty: 90 | Fill #0 | Status: AC

## 2020-11-02 ENCOUNTER — Encounter: Payer: Self-pay | Admitting: Family Medicine

## 2020-11-02 DIAGNOSIS — H8113 Benign paroxysmal vertigo, bilateral: Secondary | ICD-10-CM

## 2020-12-01 DIAGNOSIS — R42 Dizziness and giddiness: Secondary | ICD-10-CM | POA: Diagnosis not present

## 2020-12-01 DIAGNOSIS — J351 Hypertrophy of tonsils: Secondary | ICD-10-CM | POA: Diagnosis not present

## 2020-12-01 DIAGNOSIS — J343 Hypertrophy of nasal turbinates: Secondary | ICD-10-CM | POA: Diagnosis not present

## 2020-12-01 DIAGNOSIS — H6121 Impacted cerumen, right ear: Secondary | ICD-10-CM | POA: Diagnosis not present

## 2021-01-07 DIAGNOSIS — R42 Dizziness and giddiness: Secondary | ICD-10-CM | POA: Diagnosis not present

## 2021-05-16 ENCOUNTER — Telehealth: Payer: Self-pay | Admitting: Family Medicine

## 2021-05-16 DIAGNOSIS — Z0000 Encounter for general adult medical examination without abnormal findings: Secondary | ICD-10-CM

## 2021-05-16 DIAGNOSIS — Z125 Encounter for screening for malignant neoplasm of prostate: Secondary | ICD-10-CM

## 2021-05-16 DIAGNOSIS — E781 Pure hyperglyceridemia: Secondary | ICD-10-CM

## 2021-05-16 DIAGNOSIS — R7303 Prediabetes: Secondary | ICD-10-CM

## 2021-05-16 DIAGNOSIS — D72829 Elevated white blood cell count, unspecified: Secondary | ICD-10-CM

## 2021-05-16 NOTE — Telephone Encounter (Signed)
-----   Message from Ellamae Sia sent at 05/13/2021 10:56 AM EST ----- Regarding: Lab orders for Monday, 2.27.23 Patient is scheduled for CPX labs, please order future labs, Thanks , Karna Christmas

## 2021-05-17 ENCOUNTER — Other Ambulatory Visit (INDEPENDENT_AMBULATORY_CARE_PROVIDER_SITE_OTHER): Payer: BC Managed Care – PPO

## 2021-05-17 ENCOUNTER — Other Ambulatory Visit: Payer: Self-pay

## 2021-05-17 DIAGNOSIS — D72829 Elevated white blood cell count, unspecified: Secondary | ICD-10-CM

## 2021-05-17 DIAGNOSIS — Z0000 Encounter for general adult medical examination without abnormal findings: Secondary | ICD-10-CM

## 2021-05-17 DIAGNOSIS — Z125 Encounter for screening for malignant neoplasm of prostate: Secondary | ICD-10-CM

## 2021-05-17 DIAGNOSIS — R7303 Prediabetes: Secondary | ICD-10-CM | POA: Diagnosis not present

## 2021-05-17 DIAGNOSIS — E781 Pure hyperglyceridemia: Secondary | ICD-10-CM | POA: Diagnosis not present

## 2021-05-17 LAB — CBC WITH DIFFERENTIAL/PLATELET
Basophils Absolute: 0.1 10*3/uL (ref 0.0–0.1)
Basophils Relative: 0.6 % (ref 0.0–3.0)
Eosinophils Absolute: 0.2 10*3/uL (ref 0.0–0.7)
Eosinophils Relative: 2.7 % (ref 0.0–5.0)
HCT: 44.4 % (ref 39.0–52.0)
Hemoglobin: 15 g/dL (ref 13.0–17.0)
Lymphocytes Relative: 33.8 % (ref 12.0–46.0)
Lymphs Abs: 3.1 10*3/uL (ref 0.7–4.0)
MCHC: 33.8 g/dL (ref 30.0–36.0)
MCV: 86.1 fl (ref 78.0–100.0)
Monocytes Absolute: 0.7 10*3/uL (ref 0.1–1.0)
Monocytes Relative: 7.5 % (ref 3.0–12.0)
Neutro Abs: 5 10*3/uL (ref 1.4–7.7)
Neutrophils Relative %: 55.4 % (ref 43.0–77.0)
Platelets: 278 10*3/uL (ref 150.0–400.0)
RBC: 5.15 Mil/uL (ref 4.22–5.81)
RDW: 14 % (ref 11.5–15.5)
WBC: 9 10*3/uL (ref 4.0–10.5)

## 2021-05-17 LAB — COMPREHENSIVE METABOLIC PANEL
ALT: 32 U/L (ref 0–53)
AST: 18 U/L (ref 0–37)
Albumin: 4.3 g/dL (ref 3.5–5.2)
Alkaline Phosphatase: 116 U/L (ref 39–117)
BUN: 9 mg/dL (ref 6–23)
CO2: 28 mEq/L (ref 19–32)
Calcium: 9.6 mg/dL (ref 8.4–10.5)
Chloride: 105 mEq/L (ref 96–112)
Creatinine, Ser: 0.88 mg/dL (ref 0.40–1.50)
GFR: 104.54 mL/min (ref >60.00)
Glucose, Bld: 100 mg/dL — ABNORMAL HIGH (ref 70–99)
Potassium: 4.3 mEq/L (ref 3.5–5.1)
Sodium: 139 mEq/L (ref 135–145)
Total Bilirubin: 0.4 mg/dL (ref 0.2–1.2)
Total Protein: 7.2 g/dL (ref 6.0–8.3)

## 2021-05-17 LAB — LIPID PANEL
Cholesterol: 233 mg/dL — ABNORMAL HIGH (ref 0–200)
HDL: 32.4 mg/dL — ABNORMAL LOW (ref >39.00)
NonHDL: 200.29
Total CHOL/HDL Ratio: 7
Triglycerides: 378 mg/dL — ABNORMAL HIGH (ref 0.0–149.0)
VLDL: 75.6 mg/dL — ABNORMAL HIGH (ref 0.0–40.0)

## 2021-05-17 LAB — LDL CHOLESTEROL, DIRECT: Direct LDL: 153 mg/dL

## 2021-05-17 LAB — HEMOGLOBIN A1C: Hgb A1c MFr Bld: 6.1 % (ref 4.6–6.5)

## 2021-05-18 LAB — PSA: PSA: 0.12 ng/mL (ref 0.10–4.00)

## 2021-05-18 LAB — TSH: TSH: 1.58 u[IU]/mL (ref 0.35–5.50)

## 2021-05-24 ENCOUNTER — Ambulatory Visit (INDEPENDENT_AMBULATORY_CARE_PROVIDER_SITE_OTHER): Payer: BC Managed Care – PPO | Admitting: Family Medicine

## 2021-05-24 ENCOUNTER — Other Ambulatory Visit: Payer: Self-pay

## 2021-05-24 ENCOUNTER — Encounter: Payer: Self-pay | Admitting: Family Medicine

## 2021-05-24 VITALS — BP 138/84 | HR 73 | Temp 98.1°F | Ht 69.25 in | Wt 228.1 lb

## 2021-05-24 DIAGNOSIS — Z0000 Encounter for general adult medical examination without abnormal findings: Secondary | ICD-10-CM

## 2021-05-24 DIAGNOSIS — E669 Obesity, unspecified: Secondary | ICD-10-CM | POA: Diagnosis not present

## 2021-05-24 DIAGNOSIS — R7303 Prediabetes: Secondary | ICD-10-CM | POA: Diagnosis not present

## 2021-05-24 DIAGNOSIS — F17200 Nicotine dependence, unspecified, uncomplicated: Secondary | ICD-10-CM

## 2021-05-24 DIAGNOSIS — E781 Pure hyperglyceridemia: Secondary | ICD-10-CM

## 2021-05-24 DIAGNOSIS — M25512 Pain in left shoulder: Secondary | ICD-10-CM | POA: Insufficient documentation

## 2021-05-24 DIAGNOSIS — Z125 Encounter for screening for malignant neoplasm of prostate: Secondary | ICD-10-CM

## 2021-05-24 MED ORDER — FENOFIBRATE 160 MG PO TABS
ORAL_TABLET | Freq: Every day | ORAL | 3 refills | Status: DC
  Filled 2021-05-24: qty 90, 90d supply, fill #0
  Filled 2021-09-03: qty 90, 90d supply, fill #1
  Filled 2022-01-13: qty 90, 90d supply, fill #2
  Filled 2022-05-04 – 2022-05-10 (×3): qty 90, 90d supply, fill #3

## 2021-05-24 NOTE — Assessment & Plan Note (Signed)
Disc goals for lipids and reasons to control them ?Rev last labs with pt ?Rev low sat fat diet in detail ?Triglycerides and LDL are up ?Pt has no time to prep healthy food  ?Also ran out of medication and cannot get time off of work to pick it up  ?Plans to do this today  ?Will re check in a month  ?

## 2021-05-24 NOTE — Assessment & Plan Note (Signed)
Lab Results  ?Component Value Date  ? HGBA1C 6.1 05/17/2021  ? ?Stable ?disc imp of low glycemic diet and wt loss to prevent DM2  ?Job hours do not allow him time for self care  ?Would like him to cut back on sugar drinks  ?

## 2021-05-24 NOTE — Progress Notes (Signed)
Subjective:    Patient ID: Jonathan Choi, male    DOB: [birth year], 46 y.o.   MRN: [id number]  This visit occurred during the SARS-CoV-2 public health emergency.  Safety protocols were in place, including screening questions prior to the visit, additional usage of staff PPE, and extensive cleaning of exam room while observing appropriate contact time as indicated for disinfecting solutions.   HPI Here for health maintenance exam and to review chronic medical problems    Wt Readings from Last 3 Encounters:  05/24/21 228 lb 2 oz (103.5 kg)  05/28/20 218 lb 4 oz (99 kg)  05/25/20 218 lb 9 oz (99.1 kg)   33.45 kg/m  Working a lot - too much  [employer] if he could change job   Flu shot-declined Covid shots-declined   Tdap 07/2012  Not time for self care No exercise  Not sleeping enough  Eating for convenience    Smoking status : 1 ppd  Has thought about quitting  Not ready to do it yet   Once quit for 6 months   BP Readings from Last 3 Encounters:  05/24/21 138/84  05/28/20 104/72  05/25/20 124/78   Pulse Readings from Last 3 Encounters:  05/24/21 73  05/28/20 67  05/25/20 76    Hyperlipidemia Lab Results  Component Value Date   CHOL 233 (H) 05/17/2021   CHOL 187 12/24/2019   CHOL 208 (H) 12/18/2018   Lab Results  Component Value Date   HDL 32.40 (L) 05/17/2021   HDL 30.10 (L) 12/24/2019   HDL 34.30 (L) 12/18/2018   Lab Results  Component Value Date   LDLCALC 129 (H) 11/30/2015   LDLCALC 137 (H) 01/16/2015   Lab Results  Component Value Date   TRIG 378.0 (H) 05/17/2021   TRIG 220.0 (H) 12/24/2019   TRIG 285.0 (H) 12/18/2018   Lab Results  Component Value Date   CHOLHDL 7 05/17/2021   CHOLHDL 6 12/24/2019   CHOLHDL 6 12/18/2018   Lab Results  Component Value Date   LDLDIRECT 153.0 05/17/2021   LDLDIRECT 135.0 12/24/2019   LDLDIRECT 143.0 12/18/2018  Fenofibrate 160 mg daily   Has not been able to eat well   Has missed some  doses of fenobrate  Has not had time to pick it up from the pharmacy Has been out for a while   Prediabetes Lab Results  Component Value Date   HGBA1C 6.1 05/17/2021  6.2 last time   He has cut back on some sugar  Still drinks sugar soda  1-2 energy drinks per week   Ice cream occasionally   Prostate cancer screening  Lab Results  Component Value Date   PSA 0.12 05/17/2021   PSA 0.15 12/24/2019   PSA 0.16 12/06/2017   No urinary changes  No nocturia    Lab Results  Component Value Date   CREATININE 0.88 05/17/2021   BUN 9 05/17/2021   NA 139 05/17/2021   K 4.3 05/17/2021   CL 105 05/17/2021   CO2 28 05/17/2021   Lab Results  Component Value Date   ALT 32 05/17/2021   AST 18 05/17/2021   ALKPHOS 116 05/17/2021   BILITOT 0.4 05/17/2021   Lab Results  Component Value Date   WBC 9.0 05/17/2021   HGB 15.0 05/17/2021   HCT 44.4 05/17/2021   MCV 86.1 05/17/2021   PLT 278.0 05/17/2021   Lab Results  Component Value Date   TSH 1.58 05/17/2021  Patient Active Problem List   Diagnosis Date Noted   Left shoulder pain 05/24/2021   Encounter for hepatitis C screening test for low risk patient 12/26/2019   Benign paroxysmal positional vertigo 07/15/2019   Allergic rhinitis 07/15/2019   Umbilical hernia 12/24/2018   Prostate cancer screening 11/30/2017   Prediabetes 12/06/2016   Low libido 10/20/2014   Hypertriglyceridemia 10/13/2014   Routine general medical examination at a health care facility 10/07/2014   Smoking 04/14/2014   Obesity (BMI 30-39.9) 04/14/2014   Past Medical History:  Diagnosis Date   Abrasion of anterior right lower leg 12/22/2015   Dental crown present    Ganglion cyst of wrist, left 12/2015   High triglycerides    Past Surgical History:  Procedure Laterality Date   KNEE ARTHROSCOPY Right    MASS EXCISION Left 12/29/2015   Procedure: EXCISION MASS, left wrist;  Surgeon: Betha Loa, MD;  Location: [facility];   Service: Orthopedics;  Laterality: Left;   Social History   Tobacco Use   Smoking status: Every Day    Packs/day: 0.50    Years: 15.00    Pack years: 7.50    Types: Cigarettes   Smokeless tobacco: Former    Types: Chew    Quit date: 1997  Vaping Use   Vaping Use: Never used  Substance Use Topics   Alcohol use: No    Alcohol/week: 0.0 standard drinks   Drug use: No   Family History  Problem Relation Age of Onset   Hypertension Mother    Diabetes Father    Colon polyps Father    Pneumonia Father    Lung disease Father    Stroke Maternal Grandfather    Heart attack Paternal Grandmother    Allergies  Allergen Reactions   Cleocin [Clindamycin Hcl] Hives   Current Outpatient Medications on File Prior to Visit  Medication Sig Dispense Refill   Ibuprofen (ADVIL PO) Take by mouth as needed.     Multiple Vitamin (MULTIVITAMIN) tablet Take 1 tablet by mouth daily.     No current facility-administered medications on file prior to visit.     Review of Systems  Constitutional:  Positive for fatigue. Negative for activity change, appetite change, fever and unexpected weight change.  HENT:  Negative for congestion, rhinorrhea, sore throat and trouble swallowing.   Eyes:  Negative for pain, redness, itching and visual disturbance.  Respiratory:  Negative for cough, chest tightness, shortness of breath and wheezing.   Cardiovascular:  Negative for chest pain and palpitations.  Gastrointestinal:  Negative for abdominal pain, blood in stool, constipation, diarrhea and nausea.  Endocrine: Negative for cold intolerance, heat intolerance, polydipsia and polyuria.  Genitourinary:  Negative for difficulty urinating, dysuria, frequency and urgency.  Musculoskeletal:  Negative for arthralgias, joint swelling and myalgias.       Left shoulder pain  Skin:  Negative for pallor and rash.  Neurological:  Negative for dizziness, tremors, weakness, numbness and headaches.  Hematological:   Negative for adenopathy. Does not bruise/bleed easily.  Psychiatric/Behavioral:  Negative for decreased concentration and dysphoric mood. The patient is not nervous/anxious.       Objective:   Physical Exam Constitutional:      General: He is not in acute distress.    Appearance: Normal appearance. He is well-developed. He is obese. He is not ill-appearing.  HENT:     Head: Normocephalic and atraumatic.     Right Ear: Tympanic membrane, ear canal and external ear normal.  Left Ear: Tympanic membrane, ear canal and external ear normal.     Nose: Nose normal. No congestion.     Mouth/Throat:     Mouth: Mucous membranes are moist.     Pharynx: Oropharynx is clear. No posterior oropharyngeal erythema.  Eyes:     General: No scleral icterus.       Right eye: No discharge.        Left eye: No discharge.     Conjunctiva/sclera: Conjunctivae normal.     Pupils: Pupils are equal, round, and reactive to light.  Neck:     Thyroid: No thyromegaly.     Vascular: No carotid bruit or JVD.  Cardiovascular:     Rate and Rhythm: Normal rate and regular rhythm.     Pulses: Normal pulses.     Heart sounds: Normal heart sounds.    No gallop.  Pulmonary:     Effort: Pulmonary effort is normal. No respiratory distress.     Breath sounds: Normal breath sounds. No wheezing or rales.     Comments: Good air exch Chest:     Chest wall: No tenderness.  Abdominal:     General: Bowel sounds are normal. There is no distension or abdominal bruit.     Palpations: Abdomen is soft. There is no mass.     Tenderness: There is no abdominal tenderness.     Hernia: No hernia is present.  Musculoskeletal:        General: No tenderness.     Cervical back: Normal range of motion and neck supple. No rigidity. No muscular tenderness.     Right lower leg: No edema.     Left lower leg: No edema.     Comments: Limited rom L shoulder  Lymphadenopathy:     Cervical: No cervical adenopathy.  Skin:    General: Skin  is warm and dry.     Coloration: Skin is not pale.     Findings: No erythema or rash.     Comments: Solar lentigines diffusely  Lentigo on L temple-not raised and no scale  Neurological:     Mental Status: He is alert.     Cranial Nerves: No cranial nerve deficit.     Motor: No abnormal muscle tone.     Coordination: Coordination normal.     Gait: Gait normal.     Deep Tendon Reflexes: Reflexes are normal and symmetric. Reflexes normal.  Psychiatric:        Mood and Affect: Mood normal.        Cognition and Memory: Cognition and memory normal.          Assessment & Plan:   Problem List Items Addressed This Visit       Other   Hypertriglyceridemia    Disc goals for lipids and reasons to control them Rev last labs with pt Rev low sat fat diet in detail Triglycerides and LDL are up Pt has no time to prep healthy food  Also ran out of medication and cannot get time off of work to pick it up  Plans to do this today  Will re check in a month       Relevant Medications   fenofibrate 160 MG tablet   Other Relevant Orders   Lipid panel   Obesity (BMI 30-39.9)    Discussed how this problem influences overall health and the risks it imposes  Reviewed plan for weight loss with lower calorie diet (via better food choices and also portion  control or program like weight watchers) and exercise building up to or more than 30 minutes 5 days per week including some aerobic activity         Prediabetes    Lab Results  Component Value Date   HGBA1C 6.1 05/17/2021  Stable disc imp of low glycemic diet and wt loss to prevent DM2  Job hours do not allow him time for self care  Would like him to cut back on sugar drinks       Prostate cancer screening    Lab Results  Component Value Date   PSA 0.12 05/17/2021   PSA 0.15 12/24/2019   PSA 0.16 12/06/2017   No voiding changes  No fam hx      Routine general medical examination at a health care facility - Primary     Reviewed health habits including diet and exercise and skin cancer prevention Reviewed appropriate screening tests for age  Also reviewed health mt list, fam hx and immunization status , as well as social and family history   See HPI Labs reviewed  Plan to re check cholesterol after getting back on med  F/u with [facility] for shoulder  psa is stable Declines flu and covid shots  Not ready to quit smoking Encouraged sun protection to prevent skin cancer Self care is limited due to long job hours       Smoking    Disc in detail risks of smoking and possible outcomes including copd, vascular/ heart disease, cancer , respiratory and sinus infections  Pt voices understanding He is not ready to quit  In past re started due to stress Will need other coping strategies in the future to be able to quit

## 2021-05-24 NOTE — Patient Instructions (Addendum)
Keep thinking about quitting smoking  ? ?Get back on your fenofibate  ?Watch your diet the best you can  ? ?Be as active as you can  ? ?Let's schedule labs in about a month  ? ?Take care of yourself  ? ? ?

## 2021-05-24 NOTE — Assessment & Plan Note (Signed)
Hurts to lift or raise above midline ?Will make appt with sport med ?

## 2021-05-24 NOTE — Assessment & Plan Note (Signed)
Disc in detail risks of smoking and possible outcomes including copd, vascular/ heart disease, cancer , respiratory and sinus infections  ?Pt voices understanding ?He is not ready to quit  ?In past re started due to stress ?Will need other coping strategies in the future to be able to quit  ?

## 2021-05-24 NOTE — Assessment & Plan Note (Addendum)
Reviewed health habits including diet and exercise and skin cancer prevention ?Reviewed appropriate screening tests for age  ?Also reviewed health mt list, fam hx and immunization status , as well as social and family history   ?See HPI ?Labs reviewed  ?Plan to re check cholesterol after getting back on med  ?F/u with [facility] for shoulder  ?psa is stable ?Declines flu and covid shots  ?Not ready to quit smoking ?Encouraged sun protection to prevent skin cancer ?Self care is limited due to long job hours  ?

## 2021-05-24 NOTE — Assessment & Plan Note (Signed)
Lab Results  ?Component Value Date  ? PSA 0.12 05/17/2021  ? PSA 0.15 12/24/2019  ? PSA 0.16 12/06/2017  ? ? ?No voiding changes  ?No fam hx ?

## 2021-05-24 NOTE — Assessment & Plan Note (Signed)
Discussed how this problem influences overall health and the risks it imposes  Reviewed plan for weight loss with lower calorie diet (via better food choices and also portion control or program like weight watchers) and exercise building up to or more than 30 minutes 5 days per week including some aerobic activity    

## 2021-06-02 ENCOUNTER — Other Ambulatory Visit: Payer: Self-pay

## 2021-06-17 ENCOUNTER — Other Ambulatory Visit: Payer: Self-pay

## 2021-06-23 ENCOUNTER — Emergency Department (HOSPITAL_COMMUNITY): Payer: BC Managed Care – PPO

## 2021-06-23 ENCOUNTER — Other Ambulatory Visit: Payer: Self-pay

## 2021-06-23 ENCOUNTER — Encounter (HOSPITAL_COMMUNITY): Payer: Self-pay

## 2021-06-23 ENCOUNTER — Emergency Department (HOSPITAL_COMMUNITY)
Admission: EM | Admit: 2021-06-23 | Discharge: 2021-06-23 | Disposition: A | Discharge status: Home / Self Care | Payer: BC Managed Care – PPO | Attending: Emergency Medicine | Admitting: Emergency Medicine

## 2021-06-23 ENCOUNTER — Ambulatory Visit
Admission: EM | Admit: 2021-06-23 | Discharge: 2021-06-23 | Disposition: A | Discharge status: Home / Self Care | Payer: BC Managed Care – PPO

## 2021-06-23 DIAGNOSIS — D72829 Elevated white blood cell count, unspecified: Secondary | ICD-10-CM | POA: Insufficient documentation

## 2021-06-23 DIAGNOSIS — R1031 Right lower quadrant pain: Secondary | ICD-10-CM | POA: Diagnosis not present

## 2021-06-23 DIAGNOSIS — K429 Umbilical hernia without obstruction or gangrene: Secondary | ICD-10-CM | POA: Insufficient documentation

## 2021-06-23 LAB — COMPREHENSIVE METABOLIC PANEL
ALT: 33 U/L (ref 0–44)
AST: 25 U/L (ref 15–41)
Albumin: 4.2 g/dL (ref 3.5–5.0)
Alkaline Phosphatase: 67 U/L (ref 38–126)
Anion gap: 7 (ref 5–15)
BUN: 19 mg/dL (ref 6–20)
CO2: 22 mmol/L (ref 22–32)
Calcium: 8.9 mg/dL (ref 8.9–10.3)
Chloride: 110 mmol/L (ref 98–111)
Creatinine, Ser: 0.9 mg/dL (ref 0.61–1.24)
GFR, Estimated: >60 mL/min (ref >60)
Glucose, Bld: 131 mg/dL — ABNORMAL HIGH (ref 70–99)
Potassium: 3.6 mmol/L (ref 3.5–5.1)
Sodium: 139 mmol/L (ref 135–145)
Total Bilirubin: 0.3 mg/dL (ref 0.3–1.2)
Total Protein: 7.6 g/dL (ref 6.5–8.1)

## 2021-06-23 LAB — URINALYSIS, ROUTINE W REFLEX MICROSCOPIC
Bilirubin Urine: NEGATIVE
Glucose, UA: NEGATIVE mg/dL
Hgb urine dipstick: NEGATIVE
Ketones, ur: NEGATIVE mg/dL
Leukocytes,Ua: NEGATIVE
Nitrite: NEGATIVE
Protein, ur: NEGATIVE mg/dL
Specific Gravity, Urine: 1.031 — ABNORMAL HIGH (ref 1.005–1.030)
pH: 5 (ref 5.0–8.0)

## 2021-06-23 LAB — CBC
HCT: 42.5 % (ref 39.0–52.0)
Hemoglobin: 14.8 g/dL (ref 13.0–17.0)
MCH: 29.9 pg (ref 26.0–34.0)
MCHC: 34.8 g/dL (ref 30.0–36.0)
MCV: 85.9 fL (ref 80.0–100.0)
Platelets: 300 10*3/uL (ref 150–400)
RBC: 4.95 MIL/uL (ref 4.22–5.81)
RDW: 14.3 % (ref 11.5–15.5)
WBC: 11.1 10*3/uL — ABNORMAL HIGH (ref 4.0–10.5)
nRBC: 0 % (ref 0.0–0.2)

## 2021-06-23 LAB — LIPASE, BLOOD: Lipase: 30 U/L (ref 11–51)

## 2021-06-23 MED ORDER — FENTANYL CITRATE PF 50 MCG/ML IJ SOSY
50.0000 ug | PREFILLED_SYRINGE | Freq: Once | INTRAMUSCULAR | Status: AC
  Administered 2021-06-23: 50 ug via INTRAVENOUS
  Filled 2021-06-23: qty 1

## 2021-06-23 MED ORDER — IOHEXOL 300 MG/ML  SOLN
100.0000 mL | Freq: Once | INTRAMUSCULAR | Status: AC | PRN
  Administered 2021-06-23: 100 mL via INTRAVENOUS

## 2021-06-23 MED ORDER — LACTATED RINGERS IV BOLUS
1000.0000 mL | Freq: Once | INTRAVENOUS | Status: AC
  Administered 2021-06-23: 1000 mL via INTRAVENOUS

## 2021-06-23 NOTE — Discharge Instructions (Signed)
Please go to emergency department at [facility] for further care and management.  

## 2021-06-23 NOTE — ED Triage Notes (Signed)
Pt c/o RLQ abd pain that is primarily achy with intermittent episodes of sharp. Onset ~ 3 days ago.  ? ?Denies nausea, vomiting, diarrhea, constipation, body aches or chills.  ?

## 2021-06-23 NOTE — ED Provider Notes (Signed)
?[facility]-EMERGENCY DEPT ?Provider Note ? ? ?CSN: [id number] ?Arrival date & time: 06/23/21  1142 ? ?  ? ?History ? ?Chief Complaint  ?Patient presents with  ? Abdominal Pain  ? ? ?Jonathan Choi is a 46 y.o. male. ? ? ?Abdominal Pain ? ?46 year old male with medical history significant for obesity and HLD who presents to the emergency department with a chief complaint of abdominal pain.  The patient states that for the past 3 days he has had right lower quadrant abdominal pain.  He denies any fevers or chills.  He denies any nausea, vomiting, diarrhea or dysuria.  He denies any testicular pain.  He was sent from urgent care for CT imaging to rule out appendicitis.  He is tolerating oral intake. ? ?Home Medications ?Prior to Admission medications   ?Medication Sig Start Date End Date Taking? Authorizing Provider  ?fenofibrate 160 MG tablet TAKE 1 TABLET BY MOUTH DAILY. 05/24/21 05/24/22  Tower, Audrie Gallus, MD  ?Ibuprofen (ADVIL PO) Take by mouth as needed.    [provider]  ?Multiple Vitamin (MULTIVITAMIN) tablet Take 1 tablet by mouth daily.    [provider]  ?   ? ?Allergies    ?Cleocin [clindamycin hcl]   ? ?Review of Systems   ?Review of Systems  ?Gastrointestinal:  Positive for abdominal pain.  ?All other systems reviewed and are negative. ? ?Physical Exam ?Updated Vital Signs ?BP (!) 147/84   Pulse 64   Temp 98 ?F (36.7 ?C) (Oral)   Resp 16   SpO2 93%  ?Physical Exam ?Vitals and nursing note reviewed.  ?Constitutional:   ?   General: He is not in acute distress. ?   Appearance: He is well-developed.  ?HENT:  ?   Head: Normocephalic and atraumatic.  ?Eyes:  ?   Conjunctiva/sclera: Conjunctivae normal.  ?Cardiovascular:  ?   Rate and Rhythm: Normal rate and regular rhythm.  ?   Heart sounds: No murmur heard. ?Pulmonary:  ?   Effort: Pulmonary effort is normal. No respiratory distress.  ?   Breath sounds: Normal breath sounds.  ?Abdominal:  ?   Palpations: Abdomen is soft.   ?   Tenderness: There is abdominal tenderness in the right lower quadrant.  ?   Comments: Mild right lower quadrant tenderness to palpation, no rebound or guarding.  Umbilical hernia present, easily reducible  ?Musculoskeletal:     ?   General: No swelling.  ?   Cervical back: Neck supple.  ?Skin: ?   General: Skin is warm and dry.  ?   Capillary Refill: Capillary refill takes less than 2 seconds.  ?Neurological:  ?   Mental Status: He is alert.  ?Psychiatric:     ?   Mood and Affect: Mood normal.  ? ? ?ED Results / Procedures / Treatments   ?Labs ?(all labs ordered are listed, but only abnormal results are displayed) ?Labs Reviewed  ?COMPREHENSIVE METABOLIC PANEL - Abnormal; Notable for the following components:  ?    Result Value  ? Glucose, Bld 131 (*)   ? All other components within normal limits  ?CBC - Abnormal; Notable for the following components:  ? WBC 11.1 (*)   ? All other components within normal limits  ?URINALYSIS, ROUTINE W REFLEX MICROSCOPIC - Abnormal; Notable for the following components:  ? Specific Gravity, Urine 1.031 (*)   ? All other components within normal limits  ?LIPASE, BLOOD  ? ? ?EKG ?None ? ?Radiology ?CT ABDOMEN PELVIS  W CONTRAST ? ?Result Date: 06/23/2021 ?CLINICAL DATA:  RLQ abdominal pain (Age >= 14y) EXAM: CT ABDOMEN AND PELVIS WITH CONTRAST TECHNIQUE: Multidetector CT imaging of the abdomen and pelvis was performed using the standard protocol following bolus administration of intravenous contrast. RADIATION DOSE REDUCTION: This exam was performed according to the departmental dose-optimization program which includes automated exposure control, adjustment of the mA and/or kV according to patient size and/or use of iterative reconstruction technique. CONTRAST:  100mL OMNIPAQUE IOHEXOL 300 MG/ML  SOLN COMPARISON:  None. FINDINGS: Lower chest: There is right posterior pleural thickening with adjacent rounded consolidation, volume loss, and comet tail morphology. There is scarring in  the right middle lobe. Hepatobiliary: There is an indeterminate 2.7 cm hypodense lesion in the right hepatic dome (series 2, image 7). The gallbladder is contracted/decompressed. Pancreas: Unremarkable. No pancreatic ductal dilatation or surrounding inflammatory changes. Spleen: Normal in size without focal abnormality. Adrenals/Urinary Tract: Adrenal glands are unremarkable. No hydronephrosis or nephrolithiasis. Bladder is unremarkable. Stomach/Bowel: The stomach is within normal limits. There is no evidence of bowel obstruction.The appendix is normal. Vascular/Lymphatic: No significant vascular findings are present. No enlarged abdominal or pelvic lymph nodes. Reproductive: Unremarkable. Other: There is a fat containing umbilical hernia with a 1.5 cm aperture. There is increased stranding within the hernia. There is no bowel containing hernia. Musculoskeletal: No acute osseous abnormality. No suspicious lytic or blastic lesions. There is moderate degenerative disc disease at L1-L2. There is trace degenerative retrolisthesis at L4-L5 and L5-S1. Mild degenerative changes of the hips. IMPRESSION: Fat containing umbilical hernia with a 1.5 cm aperture and mild stranding within the herniated fat. Correlate with umbilical pain and reducibility. No bowel containing hernia. No other acute findings in the abdomen and pelvis. The appendix is normal. Right posterior basilar pleural thickening with adjacent rounded consolidation consistent with rounded atelectasis. Correlate with any prior history of right basilar pleuroparenchymal disease. Indeterminate 2.7 cm hypodense lesion in the right hepatic dome, likely a hemangioma or cyst in the absence of known malignancy. Recommend non-emergent liver protocol MRI with and without contrast for further evaluation. Electronically Signed   By: Caprice RenshawJacob  Kahn M.D.   On: 06/23/2021 14:34   ? ?Procedures ?Procedures  ? ? ?Medications Ordered in ED ?Medications  ?fentaNYL (SUBLIMAZE)  injection 50 mcg (50 mcg Intravenous Given 06/23/21 1329)  ?lactated ringers bolus 1,000 mL (0 mLs Intravenous Stopped 06/23/21 1444)  ?iohexol (OMNIPAQUE) 300 MG/ML solution 100 mL (100 mLs Intravenous Contrast Given 06/23/21 1414)  ? ? ?ED Course/ Medical Decision Making/ A&P ?  ?                        ?Medical Decision Making ?Amount and/or Complexity of Data Reviewed ?Labs: ordered. ?Radiology: ordered. ? ?Risk ?Prescription drug management. ? ? ?46 year old male with medical history significant for obesity and HLD who presents to the emergency department with a chief complaint of abdominal pain.  The patient states that for the past 3 days he has had right lower quadrant abdominal pain.  He denies any fevers or chills.  He denies any nausea, vomiting, diarrhea or dysuria.  He denies any testicular pain.  He was sent from urgent care for CT imaging to rule out appendicitis.  He is tolerating oral intake. ? ? ?Vitals and telemetry on arrival: Afebrile, not tachycardic or tachypneic, mildly hypertensive BP 153/103, saturating 95% on room air. Sinus rhythm noted on cardiac telemetry. ? ?Pertinent exam findings include:Mild right lower quadrant tenderness to  palpation with no rebound or guarding.  Umbilical hernia present, easily reducible. ? ?Differential Diagnosis: Appendicitis, Bowel Obstruction, AAA, ACS, pneumonia, pneumothorax, Pyelonephritis, Nephrolithiasis, Pancreatitis, Cholecystitis, Shingles, Perforated Bowel or Ulcer, Diverticulosis/itis, Ischemic Mesentery, Inflammatory Bowel Disease, Strangulated/Incarcerated Hernia, gastritis, PUD.  ? ?Lab results include: CBC with a nonspecific leukocytosis to 11.1, lipase normal, CMP unremarkable, urinalysis negative for Hematuria or urinary tract infection. ? ?Imaging results include: ?IMPRESSION:  ?Fat containing umbilical hernia with a 1.5 cm aperture and mild  ?stranding within the herniated fat. Correlate with umbilical pain  ?and reducibility. No bowel containing  hernia.  ?   ?No other acute findings in the abdomen and pelvis. The appendix is  ?normal.  ?   ?Right posterior basilar pleural thickening with adjacent rounded  ?consolidation consistent with rounded atelectas

## 2021-06-23 NOTE — ED Provider Notes (Signed)
? ? ?Provider Note ? ?Patient Contact: 11:08 AM (approximate) ? ? ?History  ? ?Abdominal Pain (I have been having abdominal pain for 3 days now and it is not getting any better. - Entered by patient) ? ? ?HPI ? ?Jonathan Choi is a 46 y.o. male with history of hyperlipidemia presents to the urgent care with right lower quadrant abdominal pain that has progressively worsened in intensity over the past 3 days.  Patient denies associated fever, chills, nausea or vomiting.  Patient denies hematuria or radiation of pain in the groin and denies a history of nephrolithiasis.  He denies chest pain, chest tightness and shortness of breath. ?  ? ? ?Physical Exam  ? ?Triage Vital Signs: ?ED Triage Vitals [06/23/21 1041]  ?Enc Vitals Group  ?   BP 125/85  ?   Pulse Rate 70  ?   Resp 18  ?   Temp 98 ?F (36.7 ?C)  ?   Temp Source Oral  ?   SpO2 96 %  ?   Weight   ?   Height   ?   Head Circumference   ?   Peak Flow   ?   Pain Score 0  ?   Pain Loc   ?   Pain Edu?   ?   Excl. in GC?   ? ? ?Most recent vital signs: ?Vitals:  ? 06/23/21 1041  ?BP: 125/85  ?Pulse: 70  ?Resp: 18  ?Temp: 98 ?F (36.7 ?C)  ?SpO2: 96%  ? ? ? ?General: Alert and in no acute distress. ?Eyes:  PERRL. EOMI. ?Head: No acute traumatic findings ?ENT: ?     Ears:  ?     Nose: No congestion/rhinnorhea. ?     Mouth/Throat: Mucous membranes are moist. ?Neck: No stridor. No cervical spine tenderness to palpation. ?Cardiovascular:  Good peripheral perfusion ?Respiratory: Normal respiratory effort without tachypnea or retractions. Lungs CTAB. Good air entry to the bases with no decreased or absent breath sounds. ?Gastrointestinal: Bowel sounds ?4 quadrants.  Patient has right lower quadrant abdominal tenderness with guarding  No palpable masses. No distention. No CVA tenderness. ?Musculoskeletal: Full range of motion to all extremities.  ?Neurologic:  No gross focal neurologic deficits are appreciated.  ?Skin:   No rash noted ?Other: ? ? ?ED Results / Procedures /  Treatments  ? ?Labs ?(all labs ordered are listed, but only abnormal results are displayed) ?Labs Reviewed - No data to display ? ? ? ? ? ?PROCEDURES: ? ?Critical Care performed: No ? ?Procedures ? ? ?MEDICATIONS ORDERED IN ED: ?Medications - No data to display ? ? ?IMPRESSION / MDM / ASSESSMENT AND PLAN / ED COURSE  ?I reviewed the triage vital signs and the nursing notes. ?             ?               ? ?Differential diagnosis includes, but is not limited to, appendicitis, mesenteric lymphadenitis, nephrolithiasis ? ?46 year old male presents to the urgent care with progressively worsening right lower quadrant abdominal pain for the past 3 days.  Given limited resources in the urgent care setting, will refer patient to the emergency department for further care and management.  All patient questions were answered. ? ? ?FINAL CLINICAL IMPRESSION(S) / ED DIAGNOSES  ? ?Final diagnoses:  ?RLQ abdominal pain  ? ? ? ?Rx / DC Orders  ? ?ED Discharge Orders   ? ? None  ? ?  ? ? ? ?  Note:  This document was prepared using Dragon voice recognition software and may include unintentional dictation errors. ?  ?Orvil Feil, PA-C ?06/23/21 1111 ? ?

## 2021-06-23 NOTE — ED Notes (Signed)
Patient is being discharged from the Urgent Care and sent to the Emergency Department via POV . Per Pia Mau, PA, patient is in need of higher level of care due to possible appendicitis. Patient is aware and verbalizes understanding of plan of care.  ?Vitals:  ? 06/23/21 1041  ?BP: 125/85  ?Pulse: 70  ?Resp: 18  ?Temp: 98 ?F (36.7 ?C)  ?SpO2: 96%  ? ? ?

## 2021-06-23 NOTE — Discharge Instructions (Addendum)
You were evaluated in the Emergency Department and after careful evaluation, we did not find any emergent condition requiring admission or further testing in the hospital. ? ?Your exam/testing today was overall reassuring.  Your laboratory work-up was largely reassuring.  You had a mild leukocytosis to 11.  Your umbilical hernia is easily reducible.  You did have incidental findings on your CT scan.  Your CT abdomen pelvis results are as follows: ?IMPRESSION:  ?Fat containing umbilical hernia with a 1.5 cm aperture and mild  ?stranding within the herniated fat. Correlate with umbilical pain  ?and reducibility. No bowel containing hernia.  ?   ?No other acute findings in the abdomen and pelvis. The appendix is  ?normal.  ?   ?Right posterior basilar pleural thickening with adjacent rounded  ?consolidation consistent with rounded atelectasis. Correlate with  ?any prior history of right basilar pleuroparenchymal disease.  ?   ?Indeterminate 2.7 cm hypodense lesion in the right hepatic dome,  ?likely a hemangioma or cyst in the absence of known malignancy.  ?Recommend non-emergent liver protocol MRI with and without contrast  ?for further evaluation.  ? ? ?Please return to the Emergency Department if you experience any worsening of your condition.  Thank you for allowing [place] to be a part of your care. ? ?

## 2021-06-23 NOTE — ED Triage Notes (Addendum)
Pt sent over by UC ? ?c/o right lower quadrant pain X3 days.  ? ?Denies N/V/D.  ? ? ?Ambulatory in triage  ? ?A/Ox4 ? ?

## 2021-06-24 ENCOUNTER — Encounter: Payer: Self-pay | Admitting: Family Medicine

## 2021-06-24 DIAGNOSIS — K769 Liver disease, unspecified: Secondary | ICD-10-CM

## 2021-06-27 DIAGNOSIS — K769 Liver disease, unspecified: Secondary | ICD-10-CM | POA: Insufficient documentation

## 2021-06-27 NOTE — Telephone Encounter (Signed)
I put the MRI order in  ?If any problems let me know  ? ? ?

## 2021-06-28 ENCOUNTER — Telehealth: Payer: Self-pay | Admitting: Family Medicine

## 2021-06-28 DIAGNOSIS — E781 Pure hyperglyceridemia: Secondary | ICD-10-CM

## 2021-06-28 NOTE — Telephone Encounter (Signed)
-----   Message from Ronalee Red, RT sent at 06/15/2021  2:40 PM EDT ----- ?Regarding: Lab orders for Wednesday, 06/30/21 ?Lab order needed for appt on 06/30/21, please.  Thanks,  Jae Dire ? ?

## 2021-06-30 ENCOUNTER — Other Ambulatory Visit (INDEPENDENT_AMBULATORY_CARE_PROVIDER_SITE_OTHER): Payer: BC Managed Care – PPO

## 2021-06-30 DIAGNOSIS — E781 Pure hyperglyceridemia: Secondary | ICD-10-CM | POA: Diagnosis not present

## 2021-06-30 LAB — LIPID PANEL
Cholesterol: 203 mg/dL — ABNORMAL HIGH (ref 0–200)
HDL: 35.1 mg/dL — ABNORMAL LOW (ref >39.00)
LDL Cholesterol: 133 mg/dL — ABNORMAL HIGH (ref 0–99)
NonHDL: 168.01
Total CHOL/HDL Ratio: 6
Triglycerides: 177 mg/dL — ABNORMAL HIGH (ref 0.0–149.0)
VLDL: 35.4 mg/dL (ref 0.0–40.0)

## 2021-06-30 LAB — ALT: ALT: 30 U/L (ref 0–53)

## 2021-06-30 LAB — AST: AST: 22 U/L (ref 0–37)

## 2021-07-08 ENCOUNTER — Encounter: Payer: Self-pay | Admitting: Family Medicine

## 2021-07-08 ENCOUNTER — Other Ambulatory Visit: Payer: Self-pay

## 2021-07-08 ENCOUNTER — Ambulatory Visit: Payer: BC Managed Care – PPO | Admitting: Family Medicine

## 2021-07-08 ENCOUNTER — Ambulatory Visit (INDEPENDENT_AMBULATORY_CARE_PROVIDER_SITE_OTHER)
Admission: RE | Admit: 2021-07-08 | Discharge: 2021-07-08 | Disposition: A | Discharge status: Home / Self Care | Payer: BC Managed Care – PPO | Source: Ambulatory Visit | Attending: Family Medicine | Admitting: Family Medicine

## 2021-07-08 VITALS — BP 114/86 | HR 70 | Temp 97.3°F | Resp 12 | Ht 69.25 in | Wt 230.0 lb

## 2021-07-08 DIAGNOSIS — M25512 Pain in left shoulder: Secondary | ICD-10-CM | POA: Diagnosis not present

## 2021-07-08 DIAGNOSIS — M7542 Impingement syndrome of left shoulder: Secondary | ICD-10-CM | POA: Diagnosis not present

## 2021-07-08 DIAGNOSIS — M7522 Bicipital tendinitis, left shoulder: Secondary | ICD-10-CM

## 2021-07-08 DIAGNOSIS — M25511 Pain in right shoulder: Secondary | ICD-10-CM | POA: Diagnosis not present

## 2021-07-08 MED ORDER — PREDNISONE 20 MG PO TABS
ORAL_TABLET | ORAL | 0 refills | Status: DC
  Filled 2021-07-08: qty 15, 10d supply, fill #0

## 2021-07-08 NOTE — Patient Instructions (Addendum)
Voltaren 1% gel, over the counter ?You can apply up to 4 times a day ? ?This can be applied to any joint: knee, wrist, fingers, elbows, shoulders, feet and ankles. ?Can apply to any tendon: tennis elbow, achilles, tendon, rotator cuff or any other tendon. ? ?Minimal is absorbed in the bloodstream: ok with oral anti-inflammatory or a blood thinner. ? ?Cost is about 9 dollars  ? ? ?Lidocaine 4% cream or patches. ?

## 2021-07-08 NOTE — Progress Notes (Signed)
? ? ?Jonathan Hobin T. Larson Limones, MD, CAQ Sports Medicine ?Nature conservation officer at [facility] ?[address] [place] ?[place], [address] ? ?Phone: [phone]  FAX: [phone] ? ?Jonathan Choi - 46 y.o. male  MRN [id number]  Date of Birth: 25-Jul-1975 ? ?Date: 07/08/2021  PCP: Jonathan Pimple, MD  Referral: Jonathan Pimple, MD ? ?Chief Complaint  ?Patient presents with  ? Shoulder Pain  ?  X several months Left shoulder, no injury.  ? ? ?This visit occurred during the SARS-CoV-2 public health emergency.  Safety protocols were in place, including screening questions prior to the visit, additional usage of staff PPE, and extensive cleaning of exam room while observing appropriate contact time as indicated for disinfecting solutions.  ? ?Subjective:  ? ?Jonathan Choi is a 46 y.o. very pleasant male patient with Body mass index is 33.72 kg/m?. who presents with the following: ? ?L shoulder pain.  Notices with driing.  ? ?Is a very pleasant gentleman and he has a Psychologist, occupational by trade.  He is right-hand dominant, and he presents today with some ongoing left-sided shoulder pain.  He does have some pain with reaching across his body as well as in abduction.  He does also complain of some anterior shoulder pain. ? ?He does notices quite a bit when he is at work, and he is using his hands and arms all of the time, albeit that he is right-handed and uses the side more. ? ?He has used some basic over-the-counter remedies such as NSAIDs and Tylenol without much significant relief of symptoms. ? ?He did have a remote history of falling on one of his shoulders, he is not sure exactly which one this was. ? ?Review of Systems is noted in the HPI, as appropriate ? ?Objective:  ? ?BP 114/86   Pulse 70   Temp (!) 97.3 ?F (36.3 ?C)   Resp 12   Ht 5' 9.25" (1.759 m)   Wt 230 lb (104.3 kg)   SpO2 93%   BMI 33.72 kg/m?  ? ?GEN: No acute distress; alert,appropriate. ?PULM: Breathing comfortably in no respiratory distress ?PSYCH:  Normally interactive.  ? ?Left shoulder with full range of motion in abduction and flexion.  With the shoulder abducted to 90 degrees he also has grossly full motion that is equal to the contralateral side. ? ?No pain along clavicle, he does have some pain at the [facility] joint.  He does have some pain in the bicipital groove. ?Does have a painful arc of motion. ? ?Crossover testing is positive ?Jobe testing is mildly positive ?Neer test and Leanord Asal test is also mildly positive ?Speeds and Yergason's are positive on the left ? ?Strength is 5/5 in all directions and otherwise neurovascularly intact ?No laxity of the shoulder and no significant crepitus ? ?Laboratory and Imaging Data: ?DG Shoulder Left ? ?Result Date: 07/10/2021 ?CLINICAL DATA:  Right shoulder pain x 2 months. Evaluate glenohumeral and AC joints. EXAM: LEFT SHOULDER - 2+ VIEW COMPARISON:  None. FINDINGS: Frontal, transscapular, and axillary views of the left shoulder are obtained. No fracture, subluxation, or dislocation. Joint spaces are well preserved. Visualized portions of the left chest are clear. Soft tissues are normal. IMPRESSION: 1. Unremarkable left shoulder. Electronically Signed   By: Sharlet Salina M.D.   On: 07/10/2021 09:06    ? ?On my review, patient does seem to have some degenerative changes at the acromioclavicular joint. ? ?Assessment and Plan:  ? ?  ICD-10-CM   ?1. Acute  pain of left shoulder  M25.512 DG Shoulder Left  ?  ?2. Biceps tendonitis on left  M75.22   ?  ?3. Impingement syndrome of left shoulder  M75.42   ?  ? ?Left shoulder pain with biceps tendinopathy, inflammation and arthropathy of the left AC joint with some mechanical impingement. ? ?I am going to have the patient do some alteration of activities and give him a pulse of some steroids.  He is quite strong, and he is a Psychologist, occupational with repetitive motion activity.  Mechanics and movement as well as strength are normal at the rotator cuff ? ?Meds ordered this encounter   ?Medications  ? predniSONE (DELTASONE) 20 MG tablet  ?  Sig: 2 tabs po daily for 5 days, then 1 tab po daily for 5 days  ?  Dispense:  15 tablet  ?  Refill:  0  ? ?There are no discontinued medications. ?Orders Placed This Encounter  ?Procedures  ? DG Shoulder Left  ? ? ?Follow-up: No follow-ups on file. ? ?Dragon [employer] One speech-to-text software was used for transcription in this dictation.  Possible transcriptional errors can occur using Animal nutritionist.  ? ?Signed, ? ?Jencarlo Bonadonna T. Bayani Renteria, MD ? ? ?Outpatient Encounter Medications as of 07/08/2021  ?Medication Sig  ? fenofibrate 160 MG tablet TAKE 1 TABLET BY MOUTH DAILY.  ? Ibuprofen (ADVIL PO) Take by mouth as needed.  ? Multiple Vitamin (MULTIVITAMIN) tablet Take 1 tablet by mouth daily.  ? predniSONE (DELTASONE) 20 MG tablet 2 tabs po daily for 5 days, then 1 tab po daily for 5 days  ? ?No facility-administered encounter medications on file as of 07/08/2021.  ?  ?

## 2021-07-21 ENCOUNTER — Encounter: Payer: Self-pay | Admitting: *Deleted

## 2021-07-21 ENCOUNTER — Encounter: Payer: Self-pay | Admitting: Family Medicine

## 2021-07-21 NOTE — Telephone Encounter (Signed)
Will route to [place] to f/u regarding MRI ?

## 2021-07-28 NOTE — Telephone Encounter (Signed)
This is scheduled 07/30/2021 ? ?Nothing further needed.  ? ?

## 2021-07-30 ENCOUNTER — Ambulatory Visit (HOSPITAL_COMMUNITY)
Admission: RE | Admit: 2021-07-30 | Discharge: 2021-07-30 | Disposition: A | Discharge status: Home / Self Care | Payer: BC Managed Care – PPO | Source: Ambulatory Visit | Attending: Family Medicine | Admitting: Family Medicine

## 2021-07-30 DIAGNOSIS — K769 Liver disease, unspecified: Secondary | ICD-10-CM | POA: Insufficient documentation

## 2021-07-30 DIAGNOSIS — K828 Other specified diseases of gallbladder: Secondary | ICD-10-CM | POA: Diagnosis not present

## 2021-07-30 DIAGNOSIS — K429 Umbilical hernia without obstruction or gangrene: Secondary | ICD-10-CM | POA: Diagnosis not present

## 2021-07-30 DIAGNOSIS — D1809 Hemangioma of other sites: Secondary | ICD-10-CM | POA: Diagnosis not present

## 2021-07-30 DIAGNOSIS — K760 Fatty (change of) liver, not elsewhere classified: Secondary | ICD-10-CM | POA: Diagnosis not present

## 2021-07-30 MED ORDER — GADOBUTROL 1 MMOL/ML IV SOLN
10.0000 mL | Freq: Once | INTRAVENOUS | Status: AC | PRN
  Administered 2021-07-30: 10 mL via INTRAVENOUS

## 2021-08-02 ENCOUNTER — Encounter: Payer: Self-pay | Admitting: Family Medicine

## 2021-08-02 DIAGNOSIS — K819 Cholecystitis, unspecified: Secondary | ICD-10-CM | POA: Insufficient documentation

## 2021-08-02 DIAGNOSIS — K429 Umbilical hernia without obstruction or gangrene: Secondary | ICD-10-CM

## 2021-08-03 NOTE — Telephone Encounter (Signed)
MRI was done 07/30/21 ? ?Nothing further needed.  ? ?

## 2021-08-04 ENCOUNTER — Encounter: Payer: Self-pay | Admitting: Surgery

## 2021-08-04 ENCOUNTER — Other Ambulatory Visit: Payer: Self-pay

## 2021-08-04 ENCOUNTER — Ambulatory Visit: Payer: BC Managed Care – PPO | Admitting: Surgery

## 2021-08-04 DIAGNOSIS — K811 Chronic cholecystitis: Secondary | ICD-10-CM

## 2021-08-04 DIAGNOSIS — K429 Umbilical hernia without obstruction or gangrene: Secondary | ICD-10-CM

## 2021-08-04 NOTE — Patient Instructions (Addendum)
Our surgery scheduler will call you within 24-48 hours to schedule your surgery. Please have the [place] surgery sheet available when speaking with her. ? ?Minimally Invasive Cholecystectomy ?Minimally invasive cholecystectomy is surgery to remove the gallbladder. The gallbladder is a pear-shaped organ that lies beneath the liver on the right side of the body. The gallbladder stores bile, which is a fluid that helps the body digest fats. Cholecystectomy is often done to treat inflammation (irritation and swelling) of the gallbladder (cholecystitis). This condition is usually caused by a buildup of gallstones (cholelithiasis) in the gallbladder or when the fluid in the gall bladder becomes stagnant because gallstones get stuck in the ducts (tubes) and block the flow of bile. This can result in inflammation and pain. In severe cases, emergency surgery may be required. ?This procedure is done through small incisions in the abdomen, instead of one large incision. It is also called laparoscopic surgery. A thin scope with a camera (laparoscope) is inserted through one incision. Then surgical instruments are inserted through the other incisions. In some cases, a minimally invasive surgery may need to be changed to a surgery that is done through a larger incision. This is called open surgery. ?Tell a health care provider about: ?Any allergies you have. ?All medicines you are taking, including vitamins, herbs, eye drops, creams, and over-the-counter medicines. ?Any problems you or family members have had with anesthetic medicines. ?Any bleeding problems you have. ?Any surgeries you have had. ?Any medical conditions you have. ?Whether you are pregnant or may be pregnant. ?What are the risks? ?Generally, this is a safe procedure. However, problems may occur, including: ?Infection. ?Bleeding. ?Allergic reactions to medicines. ?Damage to nearby structures or organs. ?A gallstone remaining in the common bile duct. The common bile  duct carries bile from the gallbladder to the small intestine. ?A bile leak from the liver or cystic duct after your gallbladder is removed. ?What happens before the procedure? ?When to stop eating and drinking ?Follow instructions from your health care provider about what you may eat and drink before your procedure. These may include: ?8 hours before the procedure ?Stop eating most foods. Do not eat meat, fried foods, or fatty foods. ?Eat only light foods, such as toast or crackers. ?All liquids are okay except energy drinks and alcohol. ?6 hours before the procedure ?Stop eating. ?Drink only clear liquids, such as water, clear fruit juice, black coffee, plain tea, and sports drinks. ?Do not drink energy drinks or alcohol. ?2 hours before the procedure ?Stop drinking all liquids. ?You may be allowed to take medicines with small sips of water. ?If you do not follow your health care provider's instructions, your procedure may be delayed or canceled. ?Medicines ?Ask your health care provider about: ?Changing or stopping your regular medicines. This is especially important if you are taking diabetes medicines or blood thinners. ?Taking medicines such as aspirin and ibuprofen. These medicines can thin your blood. Do not take these medicines unless your health care provider tells you to take them. ?Taking over-the-counter medicines, vitamins, herbs, and supplements. ?General instructions ?If you will be going home right after the procedure, plan to have a responsible adult: ?Take you home from the hospital or clinic. You will not be allowed to drive. ?Care for you for the time you are told. ?Do not use any products that contain nicotine or tobacco for at least 4 weeks before the procedure. These products include cigarettes, chewing tobacco, and vaping devices, such as e-cigarettes. If you need help quitting,  ask your health care provider. ?Ask your health care provider: ?How your surgery site will be marked. ?What steps  will be taken to help prevent infection. These may include: ?Removing hair at the surgery site. ?Washing skin with a germ-killing soap. ?Taking antibiotic medicine. ?What happens during the procedure? ? ?An IV will be inserted into one of your veins. ?You will be given one or both of the following: ?A medicine to help you relax (sedative). ?A medicine to make you fall asleep (general anesthetic). ?Your surgeon will make several small incisions in your abdomen. ?The laparoscope will be inserted through one of the small incisions. The camera on the laparoscope will send images to a monitor in the operating room. This lets your surgeon see inside your abdomen. ?A gas will be pumped into your abdomen. This will expand your abdomen to give the surgeon more room to perform the surgery. ?Other tools that are needed for the procedure will be inserted through the other incisions. The gallbladder will be removed through one of the incisions. ?Your common bile duct may be examined. If stones are found in the common bile duct, they may be removed. ?After your gallbladder has been removed, the incisions will be closed with stitches (sutures), staples, or skin glue. ?Your incisions will be covered with a bandage (dressing). ?The procedure may vary among health care providers and hospitals. ?What happens after the procedure? ?Your blood pressure, heart rate, breathing rate, and blood oxygen level will be monitored until you leave the hospital or clinic. ?You will be given medicines as needed to control your pain. ?You may have a drain placed in the incision. The drain will be removed a day or two after the procedure. ?Summary ?Minimally invasive cholecystectomy, also called laparoscopic cholecystectomy, is surgery to remove the gallbladder using small incisions. ?Tell your health care provider about all the medical conditions you have and all the medicines you are taking for those conditions. ?Before the procedure, follow  instructions about when to stop eating and drinking and changing or stopping medicines. ?Plan to have a responsible adult care for you for the time you are told after you leave the hospital or clinic. ?This information is not intended to replace advice given to you by your health care provider. Make sure you discuss any questions you have with your health care provider. ?Document Revised: 09/08/2020 Document Reviewed: 09/08/2020 ?Elsevier Patient Education ? [place]. ?Gallbladder Eating Plan ?High blood cholesterol, obesity, a sedentary lifestyle, an unhealthy diet, and diabetes are risk factors for developing gallstones. ?If you have a gallbladder condition, you may have trouble digesting fats and tolerating high fat intake. Eating a low-fat diet can help reduce your symptoms and may be helpful before and after having surgery to remove your gallbladder (cholecystectomy). Your health care provider may recommend that you work with a dietitian to help you reduce the amount of fat in your diet. ?What are tips for following this plan? ?General guidelines ?Limit your fat intake to less than 30% of your total daily calories. If you eat around 1,800 calories each day, this means eating less than 60 grams (g) of fat per day. ?Fat is an important part of a healthy diet. Eating a low-fat diet can make it hard to maintain a healthy body weight. Ask your dietitian how much fat, calories, and other nutrients you need each day. ?Eat small, frequent meals throughout the day instead of three large meals. ?Drink at least 8-10 cups (1.9-2.4 L) of fluid a  day. Drink enough fluid to keep your urine pale yellow. ?If you drink alcohol: ?Limit how much you have to: ?0-1 drink a day for women who are not pregnant. ?0-2 drinks a day for men. ?Know how much alcohol is in a drink. In the [place] one drink equals one 12 oz bottle of beer (355 mL), one 5 oz glass of wine (148 mL), or one 1? oz glass of hard liquor (44 mL). ?Reading food  labels ? ?Check nutrition facts on food labels for the amount of fat per serving. Choose foods with less than 3 grams of fat per serving. ?Shopping ?Choose nonfat and low-fat healthy foods. Look for the words

## 2021-08-05 ENCOUNTER — Telehealth: Payer: Self-pay | Admitting: Surgery

## 2021-08-05 ENCOUNTER — Ambulatory Visit: Payer: Self-pay | Admitting: Surgery

## 2021-08-05 ENCOUNTER — Encounter: Payer: Self-pay | Admitting: Surgery

## 2021-08-05 NOTE — Telephone Encounter (Signed)
Patient has been advised of Pre-Admission date/time, COVID Testing date and Surgery date.  Surgery Date: 09/30/21 Preadmission Testing Date: 09/22/21 (phone 8a-1p) Covid Testing Date: Not needed.    Patient has been made aware to call [phone], between 1-3:00pm the day before surgery, to find out what time to arrive for surgery.

## 2021-08-05 NOTE — Progress Notes (Signed)
Patient ID: Jonathan Choi, male   DOB: 02/16/1976, 46 y.o.   MRN: [id number]  HPI Jonathan Choi is a 46 y.o. male seen in consultation at the request of Dr. Dorothea Ogle.  He presented a few weeks ago to the emergency room at [facility] long complaining of right flank pain.  Pain was present for a few days sharp and moderate to severe intensity.  Initially they thought there was clinical suspicious for appendicitis.  He did have associated decreased appetite but no vomiting.  No fevers no chills no evidence of jaundice. He did had a CT scan of the abdomen pelvis that I personally reviewed, showing known umbilical hernia.  He also had a right hepatic lobe hypodense lesions that require further work-up in the form of an MRI of the abdomen, I have also personally reviewed the images, there was some hepatic asteatosis without cirrhosis and hemangiomas.  There is also thick and contracted gallbladder concerning for chronic cholecystitis. He is a Tax adviser.  He is able to perform more than 4 METS of activity without any shortness of breath or chest pain. He had prior knee surgery without any issues related to anesthetics. His wife works here for a health system at the [employer]  HPI  Past Medical History:  Diagnosis Date   Abrasion of anterior right lower leg 12/22/2015   Dental crown present    Ganglion cyst of wrist, left 12/2015   High triglycerides     Past Surgical History:  Procedure Laterality Date   KNEE ARTHROSCOPY Right    MASS EXCISION Left 12/29/2015   Procedure: EXCISION MASS, left wrist;  Surgeon: Leanora Cover, MD;  Location: [place];  Service: Orthopedics;  Laterality: Left;    Family History  Problem Relation Age of Onset   Hypertension Mother    Diabetes Father    Colon polyps Father    Pneumonia Father    Lung disease Father    Stroke Maternal Grandfather    Heart attack Paternal Grandmother     Social History Social History   Tobacco  Use   Smoking status: Every Day    Packs/day: 0.50    Years: 15.00    Pack years: 7.50    Types: Cigarettes   Smokeless tobacco: Former    Types: Chew    Quit date: 1997  Vaping Use   Vaping Use: Never used  Substance Use Topics   Alcohol use: No    Alcohol/week: 0.0 standard drinks   Drug use: No    Allergies  Allergen Reactions   Cleocin [Clindamycin Hcl] Hives    Current Outpatient Medications  Medication Sig Dispense Refill   fenofibrate 160 MG tablet TAKE 1 TABLET BY MOUTH DAILY. 90 tablet 3   Ibuprofen (ADVIL PO) Take by mouth as needed.     Multiple Vitamin (MULTIVITAMIN) tablet Take 1 tablet by mouth daily.     No current facility-administered medications for this visit.     Review of Systems Full ROS  was asked and was negative except for the information on the HPI  Physical Exam There were no vitals taken for this visit. CONSTITUTIONAL: NAD. EYES: Pupils are equal, round, Sclera are non-icteric. EARS, NOSE, MOUTH AND THROAT: TThe oral mucosa is pink and moist. Hearing is intact to voice. LYMPH NODES:  Lymph nodes in the neck are normal. RESPIRATORY:  Lungs are clear. There is normal respiratory effort, with equal breath sounds bilaterally, and without pathologic use of accessory  muscles. CARDIOVASCULAR: Heart is regular without murmurs, gallops, or rubs. GI: The abdomen is  soft, there is evidence of reducible UH measuring 3 cs,  nontender, and nondistended. There are no palpable masses. There is no hepatosplenomegaly. There are normal bowel sounds in all quadrants. GU: Rectal deferred.   MUSCULOSKELETAL: Normal muscle strength and tone. No cyanosis or edema.   SKIN: Turgor is good and there are no pathologic skin lesions or ulcers. NEUROLOGIC: Motor and sensation is grossly normal. Cranial nerves are grossly intact. PSYCH:  Oriented to person, place and time. Affect is normal.  Data Reviewed  I have personally reviewed the patient's imaging, laboratory  findings and medical records.    Assessment/Plan 46 year old male with prior abdominal pain again consistent with chronic cholecystitis based on specifically MRI.  Discussed with patient detail process at about side is definitely a potential source of his pain.  Discussed with him about options and I He does have significant size umbilical hernia that he wishes to address at the same time.  I was very candid with him and stated that depending on operative findings we may place mesh or we may decide to do a primary closure.  He understands that primary closure has a high recurrence rate but we will do this to avoid any potential complications related to infection of the mesh I discussed the procedure in detail.  The patient was given Neurosurgeon.  We discussed the risks and benefits of a laparoscopic cholecystectomy and possible cholangiogram including, but not limited to bleeding, infection, injury to surrounding structures such as the intestine or liver, bile leak, retained gallstones, need to convert to an open procedure, prolonged diarrhea, blood clots such as  DVT, common bile duct injury, anesthesia risks, and possible need for additional procedures.  The likelihood of improvement in symptoms and return to the patient's normal status is good. We discussed the typical post-operative recovery course.  A copy of this report was sent to the referring provider A copy of this report was sent to the referring provider I spent 60 minutes in this encounter including personally reviewing imaging studies, medical records, counseling the patient and family, placing orders and performing appropriate patient He wishes to schedule his cholecystectomy towards the end of June.  I would like to see him a few weeks before to make sure we have an updated conversation about his surgical intervention.  Caroleen Hamman, MD FACS General Surgeon 08/05/2021, 7:29 AM

## 2021-09-03 ENCOUNTER — Other Ambulatory Visit: Payer: Self-pay

## 2021-09-13 ENCOUNTER — Encounter: Payer: Self-pay | Admitting: Surgery

## 2021-09-13 ENCOUNTER — Ambulatory Visit: Payer: BC Managed Care – PPO | Admitting: Surgery

## 2021-09-13 ENCOUNTER — Other Ambulatory Visit: Payer: Self-pay

## 2021-09-13 VITALS — BP 135/87 | HR 75 | Temp 98.3°F | Ht 70.0 in | Wt 231.6 lb

## 2021-09-13 DIAGNOSIS — K811 Chronic cholecystitis: Secondary | ICD-10-CM | POA: Diagnosis not present

## 2021-09-13 DIAGNOSIS — K429 Umbilical hernia without obstruction or gangrene: Secondary | ICD-10-CM

## 2021-09-13 NOTE — H&P (View-Only) (Signed)
Outpatient Surgical Follow Up  09/13/2021  Jonathan Choi is an 46 y.o. male.   Chief Complaint  Patient presents with   Follow-up    HPI: Jonathan Choi is following up regarding chronic cholecystitis.  He continues to have intermittent abdominal pain.  Denies any major attacks denies any fevers any chills no biliary obstruction. He is ready to have his cholecystectomy and hernia repair No fevers no chills.\  Past Medical History:  Diagnosis Date   Abrasion of anterior right lower leg 12/22/2015   Dental crown present    Ganglion cyst of wrist, left 12/2015   High triglycerides     Past Surgical History:  Procedure Laterality Date   KNEE ARTHROSCOPY Right    MASS EXCISION Left 12/29/2015   Procedure: EXCISION MASS, left wrist;  Surgeon: Betha Loa, MD;  Location: [facility];  Service: Orthopedics;  Laterality: Left;    Family History  Problem Relation Age of Onset   Hypertension Mother    Diabetes Father    Colon polyps Father    Pneumonia Father    Lung disease Father    Stroke Maternal Grandfather    Heart attack Paternal Grandmother     Social History:  reports that he has been smoking cigarettes. He has a 7.50 pack-year smoking history. He quit smokeless tobacco use about 26 years ago.  His smokeless tobacco use included chew. He reports that he does not drink alcohol and does not use drugs.  Allergies:  Allergies  Allergen Reactions   Cleocin [Clindamycin Hcl] Hives    Medications reviewed.    ROS Full ROS performed and is otherwise negative other than what is stated in HPI   BP 135/87   Pulse 75   Temp 98.3 F (36.8 C) (Oral)   Ht 5\' 10"  (1.778 m)   Wt 231 lb 9.6 oz (105.1 kg)   SpO2 94%   BMI 33.23 kg/m   Physical Exam Vitals and nursing note reviewed. Exam conducted with a chaperone present.  Constitutional:      Appearance: Normal appearance. He is normal weight. He is not ill-appearing.  Eyes:     General: No scleral icterus.        Right eye: No discharge.        Left eye: No discharge.  Cardiovascular:     Rate and Rhythm: Normal rate and regular rhythm.     Heart sounds: No murmur heard.    No gallop.  Pulmonary:     Effort: Pulmonary effort is normal. No respiratory distress.     Breath sounds: Normal breath sounds. No stridor. No wheezing or rhonchi.  Abdominal:     General: Abdomen is flat. There is no distension.     Palpations: There is no mass.     Tenderness: There is abdominal tenderness. There is no left CVA tenderness or rebound.     Hernia: No hernia is present.     Comments: TTP RUQ w/o clear cut Murphy sign   Musculoskeletal:        General: Normal range of motion.     Cervical back: Normal range of motion and neck supple. No rigidity or tenderness.  Skin:    General: Skin is warm and dry.     Capillary Refill: Capillary refill takes less than 2 seconds.  Neurological:     General: No focal deficit present.     Mental Status: He is alert and oriented to person, place, and time.  Psychiatric:  Mood and Affect: Mood normal.        Behavior: Behavior normal.        Thought Content: Thought content normal.        Judgment: Judgment normal.      Assessment/Plan: 46 year old male with prior abdominal pain again consistent with chronic cholecystitis based on specifically MRI.  Discussed with patient detail process at about side is definitely a potential source of his pain.  Discussed with him about options and I He does have significant size umbilical hernia that he wishes to address at the same time.  I was very candid with him and stated that depending on operative findings we may place mesh or we may decide to do a primary closure.  He understands that primary closure has a high recurrence rate but we will do this to avoid any potential complications related to infection of the mesh I discussed the procedure in detail.  The patient was given Agricultural engineer.  We discussed the risks  and benefits of a laparoscopic cholecystectomy and possible cholangiogram including, but not limited to bleeding, infection, injury to surrounding structures such as the intestine or liver, bile leak, retained gallstones, need to convert to an open procedure, prolonged diarrhea, blood clots such as  DVT, common bile duct injury, anesthesia risks, and possible need for additional procedures.  The likelihood of improvement in symptoms and return to the patient's normal status is good. We discussed the typical post-operative recovery course.  A copy of this report was sent to the referring provider I spent 30 minutes in this encounter including personally reviewing imaging studies, medical records, counseling the patient and family, placing orders and performing appropriate documentation    Sterling Big, MD [facility] General Surgeon

## 2021-09-14 ENCOUNTER — Telehealth: Payer: Self-pay | Admitting: *Deleted

## 2021-09-18 HISTORY — PX: CHOLECYSTECTOMY, LAPAROSCOPIC: SHX56

## 2021-09-22 ENCOUNTER — Encounter
Admission: RE | Admit: 2021-09-22 | Discharge: 2021-09-22 | Disposition: A | Discharge status: Home / Self Care | Payer: BC Managed Care – PPO | Source: Ambulatory Visit | Attending: Surgery | Admitting: Surgery

## 2021-09-22 ENCOUNTER — Other Ambulatory Visit: Payer: Self-pay

## 2021-09-22 HISTORY — DX: Prediabetes: R73.03

## 2021-09-22 NOTE — Patient Instructions (Addendum)
Your procedure is scheduled on: 09/30/21 - Thursday Report to the Registration Desk on the 1st floor of the [facility]. To find out your arrival time, please call [phone] between 1PM - 3PM on: 09/29/21 - Wednesday If your arrival time is 6:00 am, do not arrive prior to that time as the [facility] entrance doors do not open until 6:00 am.  REMEMBER: Instructions that are not followed completely may result in serious medical risk, up to and including death; or upon the discretion of your surgeon and anesthesiologist your surgery may need to be rescheduled.  Do not eat food after midnight the night before surgery.  No gum chewing, lozengers or hard candies.  You may however, drink CLEAR liquids up to 2 hours before you are scheduled to arrive for your surgery. Do not drink anything within 2 hours of your scheduled arrival time.  Clear liquids include: - water  - apple juice without pulp - gatorade (not RED colors) - black coffee or tea (Do NOT add milk or creamers to the coffee or tea) Do NOT drink anything that is not on this list.  TAKE THESE MEDICATIONS THE MORNING OF SURGERY WITH A SIP OF WATER: NONE  One week prior to surgery: Stop Anti-inflammatories (NSAIDS) such as Advil, Aleve, Ibuprofen, Motrin, Naproxen, Naprosyn and Aspirin based products such as Excedrin, Goodys Powder, BC Powder.  Stop ANY OVER THE COUNTER supplements until after surgery. Multiple Vitamin (MULTIVITAMIN  You may take Tylenol if needed for pain up until the day of surgery.  No Alcohol for 24 hours before or after surgery.  No Smoking including e-cigarettes for 24 hours prior to surgery.  No chewable tobacco products for at least 6 hours prior to surgery.  No nicotine patches on the day of surgery.  Do not use any "recreational" drugs for at least a week prior to your surgery.  Please be advised that the combination of cocaine and anesthesia may have negative outcomes, up to and including  death. If you test positive for cocaine, your surgery will be cancelled.  On the morning of surgery brush your teeth with toothpaste and water, you may rinse your mouth with mouthwash if you wish. Do not swallow any toothpaste or mouthwash.  Do not wear jewelry, make-up, hairpins, clips or nail polish.  Do not wear lotions, powders, or perfumes.   Do not shave body from the neck down 48 hours prior to surgery just in case you cut yourself which could leave a site for infection.  Also, freshly shaved skin may become irritated if using the CHG soap.  Contact lenses, hearing aids and dentures may not be worn into surgery.  Do not bring valuables to the hospital. [facility] is not responsible for any missing/lost belongings or valuables.   Notify your doctor if there is any change in your medical condition (cold, fever, infection).  Wear comfortable clothing (specific to your surgery type) to the hospital.  After surgery, you can help prevent lung complications by doing breathing exercises.  Take deep breaths and cough every 1-2 hours. Your doctor may order a device called an Incentive Spirometer to help you take deep breaths. When coughing or sneezing, hold a pillow firmly against your incision with both hands. This is called "splinting." Doing this helps protect your incision. It also decreases belly discomfort.  If you are being admitted to the hospital overnight, leave your suitcase in the car. After surgery it may be brought to your room.  If  you are being discharged the day of surgery, you will not be allowed to drive home. You will need a responsible adult (18 years or older) to drive you home and stay with you that night.   If you are taking public transportation, you will need to have a responsible adult (18 years or older) with you. Please confirm with your physician that it is acceptable to use public transportation.   Please call the Pre-admissions Testing Dept. at [phone] if you have any questions about these instructions.  Surgery Visitation Policy:  Patients undergoing a surgery or procedure may have two family members or support persons with them as long as the person is not COVID-19 positive or experiencing its symptoms.   Inpatient Visitation:    Visiting hours are 7 a.m. to 8 p.m. Up to four visitors are allowed at one time in a patient room, including children. The visitors may rotate out with other people during the day. One designated support person (adult) may remain overnight.

## 2021-09-30 ENCOUNTER — Ambulatory Visit
Admission: RE | Admit: 2021-09-30 | Discharge: 2021-09-30 | Disposition: A | Discharge status: Home / Self Care | Payer: BC Managed Care – PPO | Attending: Surgery | Admitting: Surgery

## 2021-09-30 ENCOUNTER — Ambulatory Visit: Payer: BC Managed Care – PPO | Admitting: Anesthesiology

## 2021-09-30 ENCOUNTER — Encounter
Admission: RE | Disposition: A | Discharge status: Home / Self Care | Payer: Self-pay | Source: Home / Self Care | Attending: Surgery

## 2021-09-30 ENCOUNTER — Other Ambulatory Visit: Payer: Self-pay

## 2021-09-30 ENCOUNTER — Encounter: Payer: Self-pay | Admitting: Surgery

## 2021-09-30 DIAGNOSIS — F17210 Nicotine dependence, cigarettes, uncomplicated: Secondary | ICD-10-CM | POA: Insufficient documentation

## 2021-09-30 DIAGNOSIS — E669 Obesity, unspecified: Secondary | ICD-10-CM | POA: Diagnosis not present

## 2021-09-30 DIAGNOSIS — K811 Chronic cholecystitis: Secondary | ICD-10-CM | POA: Diagnosis not present

## 2021-09-30 DIAGNOSIS — K429 Umbilical hernia without obstruction or gangrene: Secondary | ICD-10-CM | POA: Diagnosis not present

## 2021-09-30 DIAGNOSIS — Z6833 Body mass index (BMI) 33.0-33.9, adult: Secondary | ICD-10-CM | POA: Diagnosis not present

## 2021-09-30 DIAGNOSIS — K420 Umbilical hernia with obstruction, without gangrene: Secondary | ICD-10-CM | POA: Diagnosis not present

## 2021-09-30 DIAGNOSIS — K812 Acute cholecystitis with chronic cholecystitis: Secondary | ICD-10-CM | POA: Insufficient documentation

## 2021-09-30 HISTORY — PX: UMBILICAL HERNIA REPAIR: SHX196

## 2021-09-30 SURGERY — CHOLECYSTECTOMY, ROBOT-ASSISTED, LAPAROSCOPIC
Anesthesia: General | Site: Abdomen

## 2021-09-30 MED ORDER — SUGAMMADEX SODIUM 200 MG/2ML IV SOLN
INTRAVENOUS | Status: DC | PRN
  Administered 2021-09-30: 200 mg via INTRAVENOUS

## 2021-09-30 MED ORDER — OXYCODONE HCL 5 MG PO TABS
ORAL_TABLET | ORAL | Status: AC
  Filled 2021-09-30: qty 1

## 2021-09-30 MED ORDER — CHLORHEXIDINE GLUCONATE 0.12 % MT SOLN
15.0000 mL | Freq: Once | OROMUCOSAL | Status: AC
Start: 2021-09-30 — End: 2021-09-30

## 2021-09-30 MED ORDER — CEFAZOLIN SODIUM-DEXTROSE 2-4 GM/100ML-% IV SOLN
2.0000 g | INTRAVENOUS | Status: AC
  Administered 2021-09-30: 2 g via INTRAVENOUS

## 2021-09-30 MED ORDER — BUPIVACAINE-EPINEPHRINE (PF) 0.25% -1:200000 IJ SOLN
INTRAMUSCULAR | Status: AC
  Filled 2021-09-30: qty 30

## 2021-09-30 MED ORDER — HYDROMORPHONE HCL 1 MG/ML IJ SOLN
INTRAMUSCULAR | Status: DC | PRN
  Administered 2021-09-30 (×2): .4 mg via INTRAVENOUS
  Administered 2021-09-30: .2 mg via INTRAVENOUS

## 2021-09-30 MED ORDER — DROPERIDOL 2.5 MG/ML IJ SOLN
0.6250 mg | Freq: Once | INTRAMUSCULAR | Status: DC | PRN
Start: 2021-09-30 — End: 2021-09-30

## 2021-09-30 MED ORDER — GABAPENTIN 300 MG PO CAPS
ORAL_CAPSULE | ORAL | Status: AC
  Administered 2021-09-30: 300 mg via ORAL
  Filled 2021-09-30: qty 1

## 2021-09-30 MED ORDER — PROPOFOL 10 MG/ML IV BOLUS
INTRAVENOUS | Status: AC
  Filled 2021-09-30: qty 20

## 2021-09-30 MED ORDER — ROCURONIUM BROMIDE 100 MG/10ML IV SOLN
INTRAVENOUS | Status: DC | PRN
  Administered 2021-09-30: 20 mg via INTRAVENOUS
  Administered 2021-09-30: 60 mg via INTRAVENOUS

## 2021-09-30 MED ORDER — HYDROMORPHONE HCL 1 MG/ML IJ SOLN
INTRAMUSCULAR | Status: AC
  Filled 2021-09-30: qty 1

## 2021-09-30 MED ORDER — LIDOCAINE HCL (PF) 2 % IJ SOLN
INTRAMUSCULAR | Status: AC
  Filled 2021-09-30: qty 5

## 2021-09-30 MED ORDER — ACETAMINOPHEN 500 MG PO TABS: 1000.0000 mg | ORAL_TABLET | ORAL | Status: AC

## 2021-09-30 MED ORDER — MIDAZOLAM HCL 2 MG/2ML IJ SOLN
INTRAMUSCULAR | Status: DC | PRN
  Administered 2021-09-30: 2 mg via INTRAVENOUS

## 2021-09-30 MED ORDER — GABAPENTIN 300 MG PO CAPS: 300.0000 mg | ORAL_CAPSULE | ORAL | Status: AC

## 2021-09-30 MED ORDER — FAMOTIDINE 20 MG PO TABS: 20.0000 mg | ORAL_TABLET | Freq: Once | ORAL | Status: AC

## 2021-09-30 MED ORDER — LACTATED RINGERS IV SOLN: INTRAVENOUS | Status: DC

## 2021-09-30 MED ORDER — DEXAMETHASONE SODIUM PHOSPHATE 10 MG/ML IJ SOLN
INTRAMUSCULAR | Status: AC
  Filled 2021-09-30: qty 1

## 2021-09-30 MED ORDER — ACETAMINOPHEN 500 MG PO TABS
ORAL_TABLET | ORAL | Status: AC
  Administered 2021-09-30: 1000 mg via ORAL
  Filled 2021-09-30: qty 2

## 2021-09-30 MED ORDER — ORAL CARE MOUTH RINSE: 15.0000 mL | Freq: Once | OROMUCOSAL | Status: AC

## 2021-09-30 MED ORDER — FENTANYL CITRATE (PF) 100 MCG/2ML IJ SOLN
INTRAMUSCULAR | Status: AC
  Filled 2021-09-30: qty 2

## 2021-09-30 MED ORDER — ACETAMINOPHEN 10 MG/ML IV SOLN: 1000.0000 mg | Freq: Once | INTRAVENOUS | Status: DC | PRN

## 2021-09-30 MED ORDER — CHLORHEXIDINE GLUCONATE 0.12 % MT SOLN
OROMUCOSAL | Status: AC
  Administered 2021-09-30: 15 mL via OROMUCOSAL
  Filled 2021-09-30: qty 15

## 2021-09-30 MED ORDER — CHLORHEXIDINE GLUCONATE CLOTH 2 % EX PADS
6.0000 | MEDICATED_PAD | Freq: Once | CUTANEOUS | Status: AC
  Administered 2021-09-30: 6 via TOPICAL

## 2021-09-30 MED ORDER — BUPIVACAINE-EPINEPHRINE 0.25% -1:200000 IJ SOLN
INTRAMUSCULAR | Status: DC | PRN
  Administered 2021-09-30: 50 mL via INTRAMUSCULAR

## 2021-09-30 MED ORDER — 0.9 % SODIUM CHLORIDE (POUR BTL) OPTIME
TOPICAL | Status: DC | PRN
  Administered 2021-09-30: 500 mL

## 2021-09-30 MED ORDER — CELECOXIB 200 MG PO CAPS: 200.0000 mg | ORAL_CAPSULE | ORAL | Status: AC

## 2021-09-30 MED ORDER — HYDROCODONE-ACETAMINOPHEN 5-325 MG PO TABS
1.0000 | ORAL_TABLET | ORAL | 0 refills | Status: DC | PRN
  Filled 2021-09-30: qty 25, 4d supply, fill #0

## 2021-09-30 MED ORDER — PROPOFOL 10 MG/ML IV BOLUS
INTRAVENOUS | Status: DC | PRN
  Administered 2021-09-30: 200 mg via INTRAVENOUS

## 2021-09-30 MED ORDER — FAMOTIDINE 20 MG PO TABS
ORAL_TABLET | ORAL | Status: AC
  Administered 2021-09-30: 20 mg via ORAL
  Filled 2021-09-30: qty 1

## 2021-09-30 MED ORDER — OXYCODONE HCL 5 MG/5ML PO SOLN: 5.0000 mg | Freq: Once | ORAL | Status: AC | PRN

## 2021-09-30 MED ORDER — LIDOCAINE HCL (CARDIAC) PF 100 MG/5ML IV SOSY
PREFILLED_SYRINGE | INTRAVENOUS | Status: DC | PRN
  Administered 2021-09-30: 100 mg via INTRAVENOUS

## 2021-09-30 MED ORDER — ONDANSETRON HCL 4 MG/2ML IJ SOLN
INTRAMUSCULAR | Status: AC
  Filled 2021-09-30: qty 2

## 2021-09-30 MED ORDER — HYDROCODONE-ACETAMINOPHEN 5-325 MG PO TABS: 1.0000 | ORAL_TABLET | ORAL | 0 refills | Status: DC | PRN

## 2021-09-30 MED ORDER — CELECOXIB 200 MG PO CAPS
ORAL_CAPSULE | ORAL | Status: AC
  Administered 2021-09-30: 200 mg via ORAL
  Filled 2021-09-30: qty 1

## 2021-09-30 MED ORDER — FENTANYL CITRATE (PF) 100 MCG/2ML IJ SOLN
INTRAMUSCULAR | Status: DC | PRN
Start: 2021-09-30 — End: 2021-09-30
  Administered 2021-09-30: 100 ug via INTRAVENOUS

## 2021-09-30 MED ORDER — DEXMEDETOMIDINE HCL IN NACL 200 MCG/50ML IV SOLN
INTRAVENOUS | Status: DC | PRN
  Administered 2021-09-30 (×2): 10 ug via INTRAVENOUS

## 2021-09-30 MED ORDER — CHLORHEXIDINE GLUCONATE CLOTH 2 % EX PADS: 6.0000 | MEDICATED_PAD | Freq: Once | CUTANEOUS | Status: DC

## 2021-09-30 MED ORDER — FENTANYL CITRATE (PF) 100 MCG/2ML IJ SOLN
INTRAMUSCULAR | Status: AC
  Administered 2021-09-30: 50 ug via INTRAVENOUS
  Filled 2021-09-30: qty 2

## 2021-09-30 MED ORDER — ONDANSETRON HCL 4 MG/2ML IJ SOLN
INTRAMUSCULAR | Status: DC | PRN
  Administered 2021-09-30: 4 mg via INTRAVENOUS

## 2021-09-30 MED ORDER — ROCURONIUM BROMIDE 10 MG/ML (PF) SYRINGE
PREFILLED_SYRINGE | INTRAVENOUS | Status: AC
  Filled 2021-09-30: qty 10

## 2021-09-30 MED ORDER — FENTANYL CITRATE (PF) 100 MCG/2ML IJ SOLN
25.0000 ug | INTRAMUSCULAR | Status: DC | PRN
  Administered 2021-09-30 (×2): 25 ug via INTRAVENOUS

## 2021-09-30 MED ORDER — BUPIVACAINE LIPOSOME 1.3 % IJ SUSP
INTRAMUSCULAR | Status: AC
  Filled 2021-09-30: qty 20

## 2021-09-30 MED ORDER — INDOCYANINE GREEN 25 MG IV SOLR
5.0000 mg | Freq: Once | INTRAVENOUS | Status: AC
  Administered 2021-09-30: 5 mg via INTRAVENOUS
  Filled 2021-09-30: qty 2

## 2021-09-30 MED ORDER — DEXAMETHASONE SODIUM PHOSPHATE 10 MG/ML IJ SOLN
INTRAMUSCULAR | Status: DC | PRN
  Administered 2021-09-30: 10 mg via INTRAVENOUS

## 2021-09-30 MED ORDER — MIDAZOLAM HCL 2 MG/2ML IJ SOLN
INTRAMUSCULAR | Status: AC
  Filled 2021-09-30: qty 2

## 2021-09-30 MED ORDER — PROMETHAZINE HCL 25 MG/ML IJ SOLN: 6.2500 mg | INTRAMUSCULAR | Status: DC | PRN

## 2021-09-30 MED ORDER — OXYCODONE HCL 5 MG PO TABS
5.0000 mg | ORAL_TABLET | Freq: Once | ORAL | Status: AC | PRN
  Administered 2021-09-30: 5 mg via ORAL

## 2021-09-30 MED ORDER — CEFAZOLIN SODIUM-DEXTROSE 2-4 GM/100ML-% IV SOLN
INTRAVENOUS | Status: AC
  Filled 2021-09-30: qty 100

## 2021-09-30 SURGICAL SUPPLY — 65 items
ADH SKN CLS APL DERMABOND .7 (GAUZE/BANDAGES/DRESSINGS) ×1
APL PRP STRL LF DISP 70% ISPRP (MISCELLANEOUS)
APPLIER CLIP 11 MED OPEN (CLIP) ×2
APR CLP MED 11 20 MLT OPN (CLIP) ×1
BLADE CLIPPER SURG (BLADE) ×2 IMPLANT
BLADE SURG 15 STRL LF DISP TIS (BLADE) ×1 IMPLANT
BLADE SURG 15 STRL SS (BLADE) ×2
CANNULA REDUC XI 12-8 STAPL (CANNULA) ×2
CANNULA REDUCER 12-8 DVNC XI (CANNULA) ×1 IMPLANT
CATH REDDICK CHOLANGI 4FR 50CM (CATHETERS) IMPLANT
CHLORAPREP W/TINT 26 (MISCELLANEOUS) ×1 IMPLANT
CLIP APPLIE 11 MED OPEN (CLIP) ×1 IMPLANT
CLIP LIGATING HEMO O LOK GREEN (MISCELLANEOUS) ×2 IMPLANT
DERMABOND ADVANCED (GAUZE/BANDAGES/DRESSINGS) ×1
DERMABOND ADVANCED .7 DNX12 (GAUZE/BANDAGES/DRESSINGS) ×1 IMPLANT
DRAPE ARM DVNC X/XI (DISPOSABLE) ×4 IMPLANT
DRAPE COLUMN DVNC XI (DISPOSABLE) ×1 IMPLANT
DRAPE DA VINCI XI ARM (DISPOSABLE) ×8
DRAPE DA VINCI XI COLUMN (DISPOSABLE) ×2
DRAPE INCISE IOBAN 66X45 STRL (DRAPES) ×2 IMPLANT
DRAPE LAPAROTOMY 77X122 PED (DRAPES) ×2 IMPLANT
DRSG TELFA 3X8 NADH (GAUZE/BANDAGES/DRESSINGS) ×2 IMPLANT
ELECT CAUTERY BLADE 6.4 (BLADE) ×2 IMPLANT
ELECT REM PT RETURN 9FT ADLT (ELECTROSURGICAL) ×2
ELECTRODE REM PT RTRN 9FT ADLT (ELECTROSURGICAL) ×1 IMPLANT
GLOVE BIO SURGEON STRL SZ7 (GLOVE) ×4 IMPLANT
GOWN STRL REUS W/ TWL LRG LVL3 (GOWN DISPOSABLE) ×4 IMPLANT
GOWN STRL REUS W/TWL LRG LVL3 (GOWN DISPOSABLE) ×8
IRRIGATION STRYKERFLOW (MISCELLANEOUS) IMPLANT
IRRIGATOR STRYKERFLOW (MISCELLANEOUS)
IV CATH ANGIO 12GX3 LT BLUE (NEEDLE) IMPLANT
KIT PINK PAD W/HEAD ARE REST (MISCELLANEOUS) ×2
KIT PINK PAD W/HEAD ARM REST (MISCELLANEOUS) ×1 IMPLANT
LABEL OR SOLS (LABEL) ×2 IMPLANT
MANIFOLD NEPTUNE II (INSTRUMENTS) ×2 IMPLANT
NEEDLE HYPO 22GX1.5 SAFETY (NEEDLE) ×2 IMPLANT
NS IRRIG 500ML POUR BTL (IV SOLUTION) ×2 IMPLANT
OBTURATOR OPTICAL STANDARD 8MM (TROCAR) ×2
OBTURATOR OPTICAL STND 8 DVNC (TROCAR) ×1
OBTURATOR OPTICALSTD 8 DVNC (TROCAR) ×1 IMPLANT
PACK BASIN MINOR ARMC (MISCELLANEOUS) ×2 IMPLANT
PACK LAP CHOLECYSTECTOMY (MISCELLANEOUS) ×2 IMPLANT
PAD DRESSING TELFA 3X8 NADH (GAUZE/BANDAGES/DRESSINGS) ×1 IMPLANT
PENCIL ELECTRO HAND CTR (MISCELLANEOUS) ×2 IMPLANT
SEAL CANN UNIV 5-8 DVNC XI (MISCELLANEOUS) ×3 IMPLANT
SEAL XI 5MM-8MM UNIVERSAL (MISCELLANEOUS) ×6
SET TUBE SMOKE EVAC HIGH FLOW (TUBING) ×2 IMPLANT
SOLUTION ELECTROLUBE (MISCELLANEOUS) ×2 IMPLANT
SPIKE FLUID TRANSFER (MISCELLANEOUS) ×2 IMPLANT
SPONGE T-LAP 18X18 ~~LOC~~+RFID (SPONGE) ×2 IMPLANT
SPONGE T-LAP 4X18 ~~LOC~~+RFID (SPONGE) ×1 IMPLANT
STAPLER CANNULA SEAL DVNC XI (STAPLE) ×1 IMPLANT
STAPLER CANNULA SEAL XI (STAPLE) ×2
STOPCOCK 3 WAY MALE LL (IV SETS)
STOPCOCK 3WAY MALE LL (IV SETS) IMPLANT
SUT ETHIBOND NAB MO 7 #0 18IN (SUTURE) ×2 IMPLANT
SUT MNCRL AB 4-0 PS2 18 (SUTURE) ×3 IMPLANT
SUT VIC AB 3-0 SH 27 (SUTURE)
SUT VIC AB 3-0 SH 27X BRD (SUTURE) ×1 IMPLANT
SUT VICRYL 0 AB UR-6 (SUTURE) ×4 IMPLANT
SYR 20ML LL LF (SYRINGE) ×3 IMPLANT
SYR 30ML LL (SYRINGE) ×2 IMPLANT
SYS BAG RETRIEVAL 10MM (BASKET) ×2
SYSTEM BAG RETRIEVAL 10MM (BASKET) ×1 IMPLANT
WATER STERILE IRR 3000ML UROMA (IV SOLUTION) IMPLANT

## 2021-09-30 NOTE — Anesthesia Preprocedure Evaluation (Addendum)
Anesthesia Evaluation  Patient identified by MRN, date of birth, ID band Patient awake    Reviewed: Allergy & Precautions, NPO status , Patient's Chart, lab work & pertinent test results  Airway Mallampati: III  TM Distance: >3 FB Neck ROM: full    Dental  (+) Caps   Pulmonary Current Smoker,    Pulmonary exam normal        Cardiovascular negative cardio ROS Normal cardiovascular exam     Neuro/Psych negative neurological ROS  negative psych ROS   GI/Hepatic negative GI ROS, Neg liver ROS,   Endo/Other  negative endocrine ROS  Renal/GU      Musculoskeletal   Abdominal (+) + obese,   Peds  Hematology negative hematology ROS (+)   Anesthesia Other Findings chronic cholecystitis, umbilical hernia  Past Medical History: 12/22/2015: Abrasion of anterior right lower leg No date: Dental crown present 12/2015: Ganglion cyst of wrist, left No date: High triglycerides No date: Pre-diabetes  Past Surgical History: No date: KNEE ARTHROSCOPY; Right 12/29/2015: MASS EXCISION; Left     Comment:  Procedure: EXCISION MASS, left wrist;  Surgeon: Betha Loa, MD;  Location: [facility];                Service: Orthopedics;  Laterality: Left;  BMI    Body Mass Index: 33.00 kg/m      Reproductive/Obstetrics negative OB ROS                            Anesthesia Physical Anesthesia Plan  ASA: 2  Anesthesia Plan: General ETT   Post-op Pain Management: Gabapentin PO (pre-op)*, Celebrex PO (pre-op)* and Tylenol PO (pre-op)*   Induction: Intravenous  PONV Risk Score and Plan: Ondansetron, Dexamethasone and Midazolam  Airway Management Planned: Oral ETT  Additional Equipment:   Intra-op Plan:   Post-operative Plan: Extubation in OR  Informed Consent: I have reviewed the patients History and Physical, chart, labs and discussed the procedure including the risks,  benefits and alternatives for the proposed anesthesia with the patient or authorized representative who has indicated his/her understanding and acceptance.     Dental Advisory Given  Plan Discussed with: Anesthesiologist, CRNA and Surgeon  Anesthesia Plan Comments:        Anesthesia Quick Evaluation

## 2021-09-30 NOTE — Op Note (Signed)
Robotic assisted laparoscopic Cholecystectomy Primary Repair umbilical hernia incarcerated 3 cms   Pre-operative Diagnosis: chronic cholecystitis , 3 cm umbilical hernia  Post-operative Diagnosis: same  Procedure:  Robotic assisted laparoscopic Cholecystectomy Primary Repair umbilical hernia incarcerated 3 cms  Surgeon: Sterling Big, MD FACS  Anesthesia: Gen. with endotracheal tube  Findings: Chronic Cholecystitis  Incarcerated umbilical hernia 3 cms  Estimated Blood Loss: 10cc       Specimens: Gallbladder , hernia sac          Complications: none   Procedure Details  The patient was seen again in the Holding Room. The benefits, complications, treatment options, and expected outcomes were discussed with the patient. The risks of bleeding, infection, recurrence of symptoms, failure to resolve symptoms, bile duct damage, bile duct leak, retained common bile duct stone, bowel injury, any of which could require further surgery and/or ERCP, stent, or papillotomy were reviewed with the patient. The likelihood of improving the patient's symptoms with return to their baseline status is good.  The patient and/or family concurred with the proposed plan, giving informed consent.  The patient was taken to Operating Room, identified  and the procedure verified as Laparoscopic Cholecystectomy.  A Time Out was held and the above information confirmed.  Prior to the induction of general anesthesia, antibiotic prophylaxis was administered. VTE prophylaxis was in place. General endotracheal anesthesia was then administered and tolerated well. After the induction, the abdomen was prepped with Chloraprep and draped in the sterile fashion. The patient was positioned in the supine position. Infraumbilical incision was made, hernia sac was dissected free from the skin and the fascia. The sac was excised, there was chronically incarcerated fat.  I entered the abdominal cavity and a Hasson trochar was placed  after two vicryl stitches were anchored to the fascia. Pneumoperitoneum was then created with CO2 and tolerated well without any adverse changes in the patient's vital signs.  Three 8-mm ports were placed under direct vision. All skin incisions  were infiltrated with a local anesthetic agent before making the incision and placing the trocars.   The patient was positioned  in reverse Trendelenburg, robot was brought to the surgical field and docked in the standard fashion.  We made sure all the instrumentation was kept indirect view at all times and that there were no collision between the arms. I scrubbed out and went to the console.  The gallbladder was identified, the fundus grasped and retracted cephalad. Adhesions were lysed bluntly. The infundibulum was grasped and retracted laterally, exposing the peritoneum overlying the triangle of Calot. This was then divided and exposed in a blunt fashion. An extended critical view of the cystic duct and cystic artery was obtained.  The cystic duct was clearly identified and bluntly dissected.   Artery and duct were double clipped and divided. Using ICG cholangiography we visualize the cystic duct and cbd without evidence of bile injuries. The gallbladder was taken from the gallbladder fossa in a retrograde fashion with the electrocautery.  Hemostasis was achieved with the electrocautery. nspection of the right upper quadrant was performed. No bleeding, bile duct injury or leak, or bowel injury was noted. Robotic instruments and robotic arms were undocked in the standard fashion.  I scrubbed back in.  The gallbladder was removed and placed in an Endocatch bag.   Pneumoperitoneum was released.  The umbilical defect was closed w multiple interrupted 0 ethibond sutures.Marland Kitchen 4-0 subcuticular Monocryl was used to close the skin. Dermabond was  applied.  The patient was  then extubated and brought to the recovery room in stable condition. Sponge, lap, and needle counts  were correct at closure and at the conclusion of the case.               Sterling Big, MD, FACS

## 2021-09-30 NOTE — Transfer of Care (Signed)
Immediate Anesthesia Transfer of Care Note  Patient: Jonathan Choi  Procedure(s) Performed: XI ROBOTIC ASSISTED LAPAROSCOPIC CHOLECYSTECTOMY (Abdomen) INDOCYANINE GREEN FLUORESCENCE IMAGING (ICG) (Abdomen) HERNIA REPAIR UMBILICAL ADULT (Abdomen)  Patient Location: PACU  Anesthesia Type:General  Level of Consciousness: drowsy  Airway & Oxygen Therapy: Patient Spontanous Breathing and Patient connected to face mask oxygen  Post-op Assessment: Report given to RN and Post -op Vital signs reviewed and stable  Post vital signs: stable  Last Vitals:  Vitals Value Taken Time  BP 135/85 09/30/21 0906  Temp    Pulse 66 09/30/21 0909  Resp 16 09/30/21 0909  SpO2 96 % 09/30/21 0909  Vitals shown include unvalidated device data.  Last Pain:  Vitals:   09/30/21 0622  TempSrc: Oral         Complications: No notable events documented.

## 2021-09-30 NOTE — Discharge Instructions (Addendum)
Laparoscopic Cholecystectomy, Care After  ° °These instructions give you information on caring for yourself after your procedure. Your doctor may also give you more specific instructions. Call your doctor if you have any problems or questions after your procedure.  °HOME CARE  °Change your bandages (dressings) as told by your doctor.  °Keep the wound dry and clean. Wash the wound gently with soap and water. Pat the wound dry with a clean towel.  °Do not take baths, swim, or use hot tubs for 2 weeks, or as told by your doctor.  °Only take medicine as told by your doctor.  °Eat a normal diet as told by your doctor.  °Do not lift anything heavier than 10 pounds (4.5 kg) until your doctor says it is okay.  °Do not play contact sports for 1 week, or as told by your doctor. °GET HELP IF:  °Your wound is red, puffy (swollen), or painful.  °You have yellowish-white fluid (pus) coming from the wound.  °You have fluid draining from the wound for more than 1 day.  °You have a bad smell coming from the wound.  °Your wound breaks open. °GET HELP RIGHT AWAY IF:  °You have trouble breathing.  °You have chest pain.  °You have a fever >101  °You have pain in the shoulders (shoulder strap areas) that is getting worse.  °You feel dizzy or pass out (faint).  °You have severe belly (abdominal) pain.  °You feel sick to your stomach (nauseous) or throw up (vomit) for more than 1 day. ° °AMBULATORY SURGERY  °DISCHARGE INSTRUCTIONS ° ° °The drugs that you were given will stay in your system until tomorrow so for the next 24 hours you should not: ° °Drive an automobile °Make any legal decisions °Drink any alcoholic beverage ° ° °You may resume regular meals tomorrow.  Today it is better to start with liquids and gradually work up to solid foods. ° °You may eat anything you prefer, but it is better to start with liquids, then soup and crackers, and gradually work up to solid foods. ° ° °Please notify your doctor immediately if you have any  unusual bleeding, trouble breathing, redness and pain at the surgery site, drainage, fever, or pain not relieved by medication. ° ° ° °Additional Instructions: °Please contact your physician with any problems or Same Day Surgery at [phone], Monday through Friday 6 am to 4 pm, or [place] [facility] [place] Main number at [phone].  °  °

## 2021-09-30 NOTE — Anesthesia Procedure Notes (Signed)
Procedure Name: Intubation Date/Time: 09/30/2021 7:33 AM  Performed by: Rodney Booze, CRNAPre-anesthesia Checklist: Patient identified, Emergency Drugs available, Suction available and Patient being monitored Patient Re-evaluated:Patient Re-evaluated prior to induction Oxygen Delivery Method: Circle system utilized Preoxygenation: Pre-oxygenation with 100% oxygen Induction Type: IV induction Ventilation: Mask ventilation without difficulty Laryngoscope Size: Miller and 2 Grade View: Grade I Tube type: Oral Tube size: 7.0 mm Number of attempts: 1 Airway Equipment and Method: Stylet and Oral airway Placement Confirmation: ETT inserted through vocal cords under direct vision, positive ETCO2 and breath sounds checked- equal and bilateral Secured at: 21 cm Tube secured with: Tape Dental Injury: Teeth and Oropharynx as per pre-operative assessment

## 2021-09-30 NOTE — Interval H&P Note (Signed)
History and Physical Interval Note:  09/30/2021 7:20 AM  Jonathan Choi  has presented today for surgery, with the diagnosis of chronic cholecystitis, umbilical hernia.  The various methods of treatment have been discussed with the patient and family. After consideration of risks, benefits and other options for treatment, the patient has consented to  Procedure(s): XI ROBOTIC ASSISTED LAPAROSCOPIC CHOLECYSTECTOMY (N/A) INDOCYANINE GREEN FLUORESCENCE IMAGING (ICG) (N/A) HERNIA REPAIR UMBILICAL ADULT (N/A) as a surgical intervention.  The patient's history has been reviewed, patient examined, no change in status, stable for surgery.  I have reviewed the patient's chart and labs.  Questions were answered to the patient's satisfaction.     Tajanay Hurley F Benedetta Sundstrom

## 2021-10-01 ENCOUNTER — Encounter: Payer: Self-pay | Admitting: Surgery

## 2021-10-01 LAB — SURGICAL PATHOLOGY

## 2021-10-01 NOTE — Anesthesia Postprocedure Evaluation (Signed)
Anesthesia Post Note  Patient: GEORDAN XU  Procedure(s) Performed: XI ROBOTIC ASSISTED LAPAROSCOPIC CHOLECYSTECTOMY (Abdomen) INDOCYANINE GREEN FLUORESCENCE IMAGING (ICG) (Abdomen) HERNIA REPAIR UMBILICAL ADULT (Abdomen)  Patient location during evaluation: PACU Anesthesia Type: General Level of consciousness: awake and alert Pain management: pain level controlled Vital Signs Assessment: post-procedure vital signs reviewed and stable Respiratory status: spontaneous breathing, nonlabored ventilation and respiratory function stable Cardiovascular status: blood pressure returned to baseline and stable Postop Assessment: no apparent nausea or vomiting Anesthetic complications: no   No notable events documented.   Last Vitals:  Vitals:   09/30/21 1011 09/30/21 1026  BP:    Pulse:    Resp:    Temp:    SpO2: 96% 96%    Last Pain:  Vitals:   09/30/21 1011  TempSrc:   PainSc: 3                  Foye Deer

## 2021-10-12 ENCOUNTER — Other Ambulatory Visit: Payer: Self-pay

## 2021-10-12 ENCOUNTER — Ambulatory Visit (INDEPENDENT_AMBULATORY_CARE_PROVIDER_SITE_OTHER): Payer: BC Managed Care – PPO | Admitting: Physician Assistant

## 2021-10-12 ENCOUNTER — Encounter: Payer: Self-pay | Admitting: Physician Assistant

## 2021-10-12 VITALS — BP 147/90 | HR 84 | Temp 98.5°F | Ht 70.0 in | Wt 229.0 lb

## 2021-10-12 DIAGNOSIS — K811 Chronic cholecystitis: Secondary | ICD-10-CM

## 2021-10-12 DIAGNOSIS — Z09 Encounter for follow-up examination after completed treatment for conditions other than malignant neoplasm: Secondary | ICD-10-CM

## 2021-10-12 DIAGNOSIS — K429 Umbilical hernia without obstruction or gangrene: Secondary | ICD-10-CM

## 2021-10-12 NOTE — Progress Notes (Signed)
[facility] POST-OP OFFICE VISIT  10/12/2021  HPI: Jonathan Choi is a 46 y.o. male 12 days s/p robotic assisted laparoscopic cholecystectomy for chronic cholecystitis and primary repair of incarcerated umbilical hernia (3 cm) with Dr Everlene Farrier   Overall doing well He was pain free by day 2-3 He has some drainage from his umbilical incision starting on Sunday; serous, no erythema Had diarrhea with cheeseburger and grilled chicken; stools more formed today No fever, chills, nausea, emesis No other complaints   Vital signs: BP (!) 147/90   Pulse 84   Temp 98.5 F (36.9 C) (Oral)   Ht 5\' 10"  (1.778 m)   Wt 229 lb (103.9 kg)   SpO2 95%   BMI 32.86 kg/m    Physical Exam: Constitutional: Well appearing male, NAD Abdomen: Soft, non-tender, non-distended, no rebound/guarding Skin: Laparoscopic incisions are healing well, no erythema. There is a scant amount of serous drainage from umbilicus, no evidence of purulence, no evidence of infection. Suspect he may have small underlying seroma   Assessment/Plan: This is a 46 y.o. male 12 days s/p robotic assisted laparoscopic cholecystectomy for chronic cholecystitis and primary repair of incarcerated umbilical hernia (3 cm)   - Pain control prn  - Reviewed wound care recommendation; cover umbilical incision as needed.   - Reviewed lifting restrictions; 6 weeks total; work note given  - Reviewed surgical pathology; Subacute on chronic cholecystitis   - He can follow up on as needed basis; He understands to call with questions/concerns  -- 60, PA-C [facility] 10/12/2021, 2:57 PM M-F: 7am - 4pm

## 2021-10-12 NOTE — Patient Instructions (Signed)

## 2021-12-05 NOTE — Progress Notes (Unsigned)
    Jonathan Choi T. Jonathan Salais, MD, [place] [facility] [place], [address]  Phone: [phone]  FAX: [place] - 46 y.o. male  MRN [id number]  Date of Birth: 1975/04/22  Date: 12/06/2021  PCP: Abner Greenspan, MD  Referral: Abner Greenspan, MD  No chief complaint on file.  Subjective:   Jonathan Choi is a 46 y.o. very pleasant male patient with There is no height or weight on file to calculate BMI. who presents with the following:  He is here to talk about some left-sided foot pain.    Review of Systems is noted in the HPI, as appropriate  Objective:   There were no vitals taken for this visit.  GEN: No acute distress; alert,appropriate. PULM: Breathing comfortably in no respiratory distress PSYCH: Normally interactive.   Laboratory and Imaging Data:  Assessment and Plan:   ***

## 2021-12-06 ENCOUNTER — Ambulatory Visit (INDEPENDENT_AMBULATORY_CARE_PROVIDER_SITE_OTHER)
Admission: RE | Admit: 2021-12-06 | Discharge: 2021-12-06 | Disposition: A | Discharge status: Home / Self Care | Payer: BC Managed Care – PPO | Source: Ambulatory Visit | Attending: Family Medicine | Admitting: Family Medicine

## 2021-12-06 ENCOUNTER — Encounter: Payer: Self-pay | Admitting: Family Medicine

## 2021-12-06 ENCOUNTER — Ambulatory Visit (INDEPENDENT_AMBULATORY_CARE_PROVIDER_SITE_OTHER): Payer: BC Managed Care – PPO | Admitting: Family Medicine

## 2021-12-06 VITALS — BP 112/78 | HR 82 | Temp 98.1°F | Ht 69.25 in | Wt 229.4 lb

## 2021-12-06 DIAGNOSIS — M79672 Pain in left foot: Secondary | ICD-10-CM

## 2022-01-13 ENCOUNTER — Other Ambulatory Visit: Payer: Self-pay

## 2022-03-22 ENCOUNTER — Encounter: Payer: Self-pay | Admitting: Family Medicine

## 2022-03-24 ENCOUNTER — Encounter: Payer: Self-pay | Admitting: Gastroenterology

## 2022-03-24 ENCOUNTER — Ambulatory Visit (INDEPENDENT_AMBULATORY_CARE_PROVIDER_SITE_OTHER): Payer: BC Managed Care – PPO | Admitting: Family Medicine

## 2022-03-24 ENCOUNTER — Other Ambulatory Visit: Payer: Self-pay

## 2022-03-24 ENCOUNTER — Encounter: Payer: Self-pay | Admitting: Family Medicine

## 2022-03-24 VITALS — BP 144/94 | HR 76 | Temp 97.7°F | Ht 69.25 in | Wt 233.1 lb

## 2022-03-24 DIAGNOSIS — K625 Hemorrhage of anus and rectum: Secondary | ICD-10-CM

## 2022-03-24 DIAGNOSIS — Z23 Encounter for immunization: Secondary | ICD-10-CM | POA: Diagnosis not present

## 2022-03-24 DIAGNOSIS — R140 Abdominal distension (gaseous): Secondary | ICD-10-CM

## 2022-03-24 MED ORDER — HYDROCORTISONE ACETATE 25 MG RE SUPP
25.0000 mg | Freq: Every day | RECTAL | 1 refills | Status: DC
  Filled 2022-03-24: qty 12, 12d supply, fill #0

## 2022-03-24 NOTE — Assessment & Plan Note (Signed)
This is new Some brb per rectum (may be hemorrhoids)  Disc fiber/fluids in diet   GI ref made Also wants to schedule colonoscopy for screening

## 2022-03-24 NOTE — Assessment & Plan Note (Signed)
On/off/ 1 week  BRB  Has external hemorrhoid and likely internal Pos heme card  No masses on rectal exam Also c/o bloating and early satiety  Ref to GI for these complaints  Rev record and note from Dr Ardis Hughs in 2019- had planned colonoscopy but had to cancel He is also due for colon cancer screening   Inst to avoid advil/nsaids (also in light of episode of epig pain that is now better)   Anusol hc supp px for 12 days to see if this helps bleeding

## 2022-03-24 NOTE — Progress Notes (Signed)
Subjective:    Patient ID: Jonathan Choi, male    DOB: Jun 18, 1975, 47 y.o.   MRN: [id number]  HPI Pt presents for personal problem / bleeding with BM  Also flu shot   Wt Readings from Last 3 Encounters:  03/24/22 233 lb 2 oz (105.7 kg)  12/06/21 229 lb 6 oz (104 kg)  10/12/21 229 lb (103.9 kg)   34.18 kg/m  Vitals:   03/24/22 0806  BP: (!) 144/94  Pulse: 76  Temp: 97.7 F (36.5 C)  TempSrc: Temporal  SpO2: 97%  Weight: 233 lb 2 oz (105.7 kg)  Height: 5' 9.25" (1.759 m)   Friday night noticed some blood with bm  Sat had 2-3 bms - bleeding more /noted dripping  Bright red   2 bm since Tuesday, did not bleed   Earlier in December he had a pain in upper abd (epigastric)  Took antacids and then prilosec otc   Then Friday night had a sharp pain across low abd   Feeling very bloated for a month and feels full easily   Has improved his diet a bit   Fruit- 2 bananas per day and lots of grapes  Has a vegetable with supper daily  Not enough fiber in diet  Fluid Coffee in am  Then 3 water bottles  2 sodas per day   Some constipated days  Not really with the bms  Not a lot of straining  Less heavy lifting since he had his surgery  but routine 40 lb - twice per week   No nausea or fever   Has a hemorrhoid- external  Occ this bothers him    Advil is on his med list  Does not take as much as he used to  No asa  No blood thinners     Fam hx Father had colon polyps  Mother had polyps    Per chart saw Dr Christella Hartigan for rectal bleeding  Was going to plan colonosocpy  Dx with hemorrhoid   Patient Active Problem List   Diagnosis Date Noted   Abdominal bloating 03/24/2022   Cholecystitis 08/02/2021   Liver lesion 06/27/2021   Left shoulder pain 05/24/2021   Encounter for hepatitis C screening test for low risk patient 12/26/2019   Benign paroxysmal positional vertigo 07/15/2019   Allergic rhinitis 07/15/2019   Umbilical hernia 12/24/2018   Prostate  cancer screening 11/30/2017   Anal bleeding 07/12/2017   Prediabetes 12/06/2016   Low libido 10/20/2014   Hypertriglyceridemia 10/13/2014   Routine general medical examination at a health care facility 10/07/2014   Smoking 04/14/2014   Obesity (BMI 30-39.9) 04/14/2014   Past Medical History:  Diagnosis Date   Abrasion of anterior right lower leg 12/22/2015   Dental crown present    Ganglion cyst of wrist, left 12/2015   High triglycerides    Pre-diabetes    Past Surgical History:  Procedure Laterality Date   KNEE ARTHROSCOPY Right    MASS EXCISION Left 12/29/2015   Procedure: EXCISION MASS, left wrist;  Surgeon: Betha Loa, MD;  Location: [facility];  Service: Orthopedics;  Laterality: Left;   UMBILICAL HERNIA REPAIR N/A 09/30/2021   Procedure: HERNIA REPAIR UMBILICAL ADULT;  Surgeon: Leafy Ro, MD;  Location: [facility];  Service: General;  Laterality: N/A;   Social History   Tobacco Use   Smoking status: Every Day    Packs/day: 0.50    Years: 15.00    Total pack years: 7.50  Types: Cigarettes   Smokeless tobacco: Former    Types: Chew    Quit date: 1997  Vaping Use   Vaping Use: Never used  Substance Use Topics   Alcohol use: No    Alcohol/week: 0.0 standard drinks of alcohol   Drug use: No   Family History  Problem Relation Age of Onset   Hypertension Mother    Diabetes Father    Colon polyps Father    Pneumonia Father    Lung disease Father    Stroke Maternal Grandfather    Heart attack Paternal Grandmother    Allergies  Allergen Reactions   Cleocin [Clindamycin Hcl] Hives   Current Outpatient Medications on File Prior to Visit  Medication Sig Dispense Refill   fenofibrate 160 MG tablet TAKE 1 TABLET BY MOUTH DAILY. 90 tablet 3   Ibuprofen (ADVIL PO) Take by mouth as needed.     Melatonin 10 MG TABS Take 1 tablet by mouth as needed.     Multiple Vitamin (MULTIVITAMIN) tablet Take 1 tablet by mouth daily.     No current  facility-administered medications on file prior to visit.    Review of Systems  Constitutional:  Negative for activity change, appetite change, fatigue, fever and unexpected weight change.  HENT:  Negative for congestion, rhinorrhea, sore throat and trouble swallowing.   Eyes:  Negative for pain, redness, itching and visual disturbance.  Respiratory:  Negative for cough, chest tightness, shortness of breath and wheezing.   Cardiovascular:  Negative for chest pain and palpitations.  Gastrointestinal:  Positive for abdominal distention, anal bleeding and constipation. Negative for abdominal pain, blood in stool, diarrhea, nausea, rectal pain and vomiting.  Endocrine: Negative for cold intolerance, heat intolerance, polydipsia and polyuria.  Genitourinary:  Negative for difficulty urinating, dysuria, frequency and urgency.  Musculoskeletal:  Negative for arthralgias, joint swelling and myalgias.  Skin:  Negative for pallor and rash.  Neurological:  Negative for dizziness, tremors, weakness, numbness and headaches.  Hematological:  Negative for adenopathy. Does not bruise/bleed easily.  Psychiatric/Behavioral:  Negative for decreased concentration and dysphoric mood. The patient is not nervous/anxious.        Objective:   Physical Exam Constitutional:      General: He is not in acute distress.    Appearance: Normal appearance. He is well-developed. He is obese. He is not ill-appearing or diaphoretic.  HENT:     Head: Normocephalic and atraumatic.  Eyes:     Conjunctiva/sclera: Conjunctivae normal.     Pupils: Pupils are equal, round, and reactive to light.  Neck:     Thyroid: No thyromegaly.     Vascular: No carotid bruit or JVD.  Cardiovascular:     Rate and Rhythm: Normal rate and regular rhythm.     Heart sounds: Normal heart sounds.     No gallop.  Pulmonary:     Effort: Pulmonary effort is normal. No respiratory distress.     Breath sounds: Normal breath sounds. No wheezing or  rales.  Abdominal:     General: Abdomen is protuberant. Bowel sounds are normal. There is no distension or abdominal bruit.     Palpations: Abdomen is soft. There is no hepatomegaly, splenomegaly, mass or pulsatile mass.     Tenderness: There is no abdominal tenderness. There is no guarding or rebound.     Hernia: No hernia is present.  Genitourinary:    Prostate: Not tender.     Rectum: Guaiac result positive. External hemorrhoid present. Normal anal tone.  Comments: No M on rectal exam Some discomfort  Prostate feels on the large size of normal   Ext hemorrhoid is noted/large - not thrombosed or tender  No brb noted  Musculoskeletal:     Cervical back: Normal range of motion and neck supple.     Right lower leg: No edema.     Left lower leg: No edema.  Lymphadenopathy:     Cervical: No cervical adenopathy.  Skin:    General: Skin is warm and dry.     Coloration: Skin is not pale.     Findings: No rash.  Neurological:     Mental Status: He is alert.     Motor: No weakness.     Coordination: Coordination normal.     Deep Tendon Reflexes: Reflexes are normal and symmetric. Reflexes normal.  Psychiatric:        Mood and Affect: Mood normal.           Assessment & Plan:   Problem List Items Addressed This Visit       Digestive   Anal bleeding - Primary    On/off/ 1 week  BRB  Has external hemorrhoid and likely internal Pos heme card  No masses on rectal exam Also c/o bloating and early satiety  Ref to GI for these complaints  Rev record and note from Dr Ardis Hughs in 2019- had planned colonoscopy but had to cancel He is also due for colon cancer screening   Inst to avoid advil/nsaids (also in light of episode of epig pain that is now better)   Anusol hc supp px for 12 days to see if this helps bleeding      Relevant Orders   Ambulatory referral to Gastroenterology     Other   Abdominal bloating    This is new Some brb per rectum (may be hemorrhoids)   Disc fiber/fluids in diet   GI ref made Also wants to schedule colonoscopy for screening       Relevant Orders   Ambulatory referral to Gastroenterology   Other Visit Diagnoses     Need for influenza vaccination       Relevant Orders   Flu Vaccine QUAD 6+ mos PF IM (Fluarix Quad PF) (Completed)

## 2022-03-24 NOTE — Patient Instructions (Signed)
Keep stools soft  Eat more fiber or try fiber supplement like metamucil or citrucel Keep up a good fluid intake   Try the anusol hc suppository for 12 days each bedtime If symptoms do not improve let us know   I will place a GI referral Please call the [facility] office to schedule that    [facility]  [phone]

## 2022-04-13 ENCOUNTER — Ambulatory Visit: Payer: BC Managed Care – PPO | Admitting: Gastroenterology

## 2022-04-13 ENCOUNTER — Encounter: Payer: Self-pay | Admitting: Gastroenterology

## 2022-04-13 VITALS — BP 120/90 | HR 88 | Ht 69.0 in | Wt 228.0 lb

## 2022-04-13 DIAGNOSIS — R140 Abdominal distension (gaseous): Secondary | ICD-10-CM

## 2022-04-13 DIAGNOSIS — R12 Heartburn: Secondary | ICD-10-CM

## 2022-04-13 DIAGNOSIS — Z1211 Encounter for screening for malignant neoplasm of colon: Secondary | ICD-10-CM | POA: Diagnosis not present

## 2022-04-13 DIAGNOSIS — R6881 Early satiety: Secondary | ICD-10-CM

## 2022-04-13 NOTE — Progress Notes (Signed)
04/13/2022 Jonathan Choi [id number] Oct 01, 1975   HISTORY OF PRESENT ILLNESS: This is a pleasant 47 year old male who is a patient of Dr. Ardis Hughs.  He was seen by him once back in 2019 for complaints of rectal bleeding.  They discussed and scheduled colonoscopy, but then he says that he had to cancel the procedure and never got around to rescheduling and then COVID happened, etc.  He has been referred back here now on this occasion by his PCP, Dr. Glori Bickers, for evaluation of rectal bleeding and for complaints of abdominal bloating.  He says that back in December he had 4 days with some bright red blood with bowel movements.  That has since resolved.  He says that starting at the beginning of December he has been feeling very bloated after eating.  Says that he feels full very quickly after eating just a few bites.  He has had some heartburn and then around the time that the symptoms first began he was having heartburn no matter what he ate.  Said that in the past he would usually get if he ate spicy food.  He tried Alka-Seltzer and Tums.  He did do a course of Prilosec OTC.  In regards to his bowel movements he says sometimes he will go 2 to 3 days without a bowel movement.  Sometimes he has to strain.  Has been using some Metamucil Gummies at the recommendation of his PCP, however, and that has helped to regulate his bowel habits better.  Past Medical History:  Diagnosis Date   Abrasion of anterior right lower leg 12/22/2015   Dental crown present    Ganglion cyst of wrist, left 12/2015   High triglycerides    Pre-diabetes    Past Surgical History:  Procedure Laterality Date   KNEE ARTHROSCOPY Right    MASS EXCISION Left 12/29/2015   Procedure: EXCISION MASS, left wrist;  Surgeon: Leanora Cover, MD;  Location: [facility];  Service: Orthopedics;  Laterality: Left;   UMBILICAL HERNIA REPAIR N/A 09/30/2021   Procedure: HERNIA REPAIR UMBILICAL ADULT;  Surgeon: Jules Husbands, MD;   Location: [facility];  Service: General;  Laterality: N/A;    reports that he has been smoking cigarettes. He has a 7.50 pack-year smoking history. He quit smokeless tobacco use about 27 years ago.  His smokeless tobacco use included chew. He reports that he does not drink alcohol and does not use drugs. family history includes Colon polyps in his father; Diabetes in his father; Heart attack in his paternal grandmother; Hypertension in his mother; Lung disease in his father; Pneumonia in his father; Stroke in his maternal grandfather. Allergies  Allergen Reactions   Cleocin [Clindamycin Hcl] Hives      Outpatient Encounter Medications as of 04/13/2022  Medication Sig   fenofibrate 160 MG tablet TAKE 1 TABLET BY MOUTH DAILY.   Ibuprofen (ADVIL PO) Take by mouth as needed.   Melatonin 10 MG TABS Take 1 tablet by mouth as needed.   Multiple Vitamin (MULTIVITAMIN) tablet Take 1 tablet by mouth daily.   hydrocortisone (ANUSOL-HC) 25 MG suppository Place 1 suppository (25 mg total) rectally at bedtime. (Patient not taking: Reported on 04/13/2022)   No facility-administered encounter medications on file as of 04/13/2022.     REVIEW OF SYSTEMS  : All other systems reviewed and negative except where noted in the History of Present Illness.   PHYSICAL EXAM: BP (!) 120/90   Pulse 88   Ht 5'  9" (1.753 m)   Wt 228 lb (103.4 kg)   BMI 33.67 kg/m  General: Well developed white male in no acute distress Head: Normocephalic and atraumatic Eyes:  Sclerae anicteric, conjunctiva pink. Ears: Normal auditory acuity Lungs: Clear throughout to auscultation; no W/R/R. Heart: Regular rate and rhythm; no M/R/G. Abdomen: Soft, non-distended.  BS present.  Non-tender. Rectal:  Will be done at the time of colonoscopy. Musculoskeletal: Symmetrical with no gross deformities  Skin: No lesions on visible extremities Extremities: No edema  Neurological: Alert oriented x 4, grossly non-focal Psychological:  Alert  and cooperative. Normal mood and affect  ASSESSMENT AND PLAN: *CRC screening:  Never had colonoscopy in the past.  Has had intermittent issues with bright red rectal bleeding.  Will schedule colonoscopy with Dr. Lorenso Courier. *Heartburn/abdominal bloating/early satiety:  These symptoms have been present for couple months.  He had been using a lot of NSAIDs, but stopped that in about October or November.  Has been taking Tums and did do course of Prilosec OTC.  Will plan for EGD at the time of colonoscopy as well.  **The risks, benefits, and alternatives to EGD and colonoscopy were discussed with the patient and he consents to proceed.   CC:  Tower, Wynelle Fanny, MD

## 2022-04-13 NOTE — Progress Notes (Signed)
I agree with the assessment and plan as outlined by Ms. Zehr. 

## 2022-04-13 NOTE — Patient Instructions (Addendum)
If you are age 46 or older, your body mass index should be between 23-30. Your Body mass index is 33.67 kg/m. If this is out of the aforementioned range listed, please consider follow up with your Primary Care Provider.  If you are age 76 or younger, your body mass index should be between 19-25. Your Body mass index is 33.67 kg/m. If this is out of the aformentioned range listed, please consider follow up with your Primary Care Provider.   ________________________________________________________  Jonathan Choi have been scheduled for a colonoscopy. Please follow written instructions given to you at your visit today.  Please pick up your prep supplies at the pharmacy within the next 1-3 days. If you use inhalers (even only as needed), please bring them with you on the day of your procedure.   Thank you for entrusting me with your care and for choosing [employer], [name], P.A. - C.

## 2022-05-05 ENCOUNTER — Other Ambulatory Visit: Payer: Self-pay

## 2022-05-09 ENCOUNTER — Other Ambulatory Visit: Payer: Self-pay

## 2022-05-10 ENCOUNTER — Other Ambulatory Visit: Payer: Self-pay

## 2022-05-16 ENCOUNTER — Encounter: Payer: Self-pay | Admitting: Family Medicine

## 2022-05-17 NOTE — Progress Notes (Unsigned)
    Melik Blancett T. Sueo Cullen, MD, [place] at [facility] [place], [address]  Phone: [phone]  FAX: [place] - 47 y.o. male  MRN [id number]  Date of Birth: [birth year]  Date: 05/18/2022  PCP: Abner Greenspan, MD  Referral: Abner Greenspan, MD  No chief complaint on file.  Subjective:   Jonathan Choi is a 47 y.o. very pleasant male patient with There is no height or weight on file to calculate BMI. who presents with the following:  He is a very pleasant gentleman who I seen before for a few other different musculoskeletal complaints, he presents today with some left-sided knee pain.  He was squatting down the other day and felt a pop on the inside of his knee.    Review of Systems is noted in the HPI, as appropriate  Objective:   There were no vitals taken for this visit.  GEN: No acute distress; alert,appropriate. PULM: Breathing comfortably in no respiratory distress PSYCH: Normally interactive.   Laboratory and Imaging Data:  Assessment and Plan:   ***

## 2022-05-18 ENCOUNTER — Ambulatory Visit: Payer: BC Managed Care – PPO | Admitting: Family Medicine

## 2022-05-18 ENCOUNTER — Encounter: Payer: Self-pay | Admitting: *Deleted

## 2022-05-18 ENCOUNTER — Encounter: Payer: Self-pay | Admitting: Family Medicine

## 2022-05-18 VITALS — BP 110/74 | HR 85 | Temp 97.9°F | Ht 69.25 in | Wt 228.5 lb

## 2022-05-18 DIAGNOSIS — H6121 Impacted cerumen, right ear: Secondary | ICD-10-CM

## 2022-05-18 DIAGNOSIS — S83412A Sprain of medial collateral ligament of left knee, initial encounter: Secondary | ICD-10-CM

## 2022-05-18 DIAGNOSIS — M25562 Pain in left knee: Secondary | ICD-10-CM | POA: Diagnosis not present

## 2022-05-18 NOTE — Telephone Encounter (Signed)
This encounter was created in error - please disregard.

## 2022-05-19 ENCOUNTER — Telehealth: Payer: Self-pay | Admitting: Family Medicine

## 2022-05-19 DIAGNOSIS — Z0000 Encounter for general adult medical examination without abnormal findings: Secondary | ICD-10-CM

## 2022-05-19 DIAGNOSIS — Z125 Encounter for screening for malignant neoplasm of prostate: Secondary | ICD-10-CM

## 2022-05-19 DIAGNOSIS — R7303 Prediabetes: Secondary | ICD-10-CM

## 2022-05-19 DIAGNOSIS — E781 Pure hyperglyceridemia: Secondary | ICD-10-CM

## 2022-05-19 NOTE — Telephone Encounter (Signed)
-----   Message from Velna Hatchet, RT sent at 05/05/2022 11:14 AM EST ----- Regarding: Fri 3/1 lab Patient is scheduled for cpx, please order future labs.  Thanks, Anda Kraft

## 2022-05-20 ENCOUNTER — Other Ambulatory Visit (INDEPENDENT_AMBULATORY_CARE_PROVIDER_SITE_OTHER): Payer: BC Managed Care – PPO

## 2022-05-20 DIAGNOSIS — E781 Pure hyperglyceridemia: Secondary | ICD-10-CM

## 2022-05-20 DIAGNOSIS — Z125 Encounter for screening for malignant neoplasm of prostate: Secondary | ICD-10-CM | POA: Diagnosis not present

## 2022-05-20 DIAGNOSIS — R7303 Prediabetes: Secondary | ICD-10-CM

## 2022-05-20 DIAGNOSIS — Z0000 Encounter for general adult medical examination without abnormal findings: Secondary | ICD-10-CM

## 2022-05-20 LAB — CBC WITH DIFFERENTIAL/PLATELET
Basophils Absolute: 0.1 10*3/uL (ref 0.0–0.1)
Basophils Relative: 0.8 % (ref 0.0–3.0)
Eosinophils Absolute: 0.2 10*3/uL (ref 0.0–0.7)
Eosinophils Relative: 1.9 % (ref 0.0–5.0)
HCT: 43.2 % (ref 39.0–52.0)
Hemoglobin: 14.7 g/dL (ref 13.0–17.0)
Lymphocytes Relative: 32.8 % (ref 12.0–46.0)
Lymphs Abs: 3.5 10*3/uL (ref 0.7–4.0)
MCHC: 34.1 g/dL (ref 30.0–36.0)
MCV: 86.9 fl (ref 78.0–100.0)
Monocytes Absolute: 0.8 10*3/uL (ref 0.1–1.0)
Monocytes Relative: 7.5 % (ref 3.0–12.0)
Neutro Abs: 6 10*3/uL (ref 1.4–7.7)
Neutrophils Relative %: 57 % (ref 43.0–77.0)
Platelets: 317 10*3/uL (ref 150.0–400.0)
RBC: 4.97 Mil/uL (ref 4.22–5.81)
RDW: 13.9 % (ref 11.5–15.5)
WBC: 10.5 10*3/uL (ref 4.0–10.5)

## 2022-05-20 LAB — COMPREHENSIVE METABOLIC PANEL
ALT: 36 U/L (ref 0–53)
AST: 19 U/L (ref 0–37)
Albumin: 4.2 g/dL (ref 3.5–5.2)
Alkaline Phosphatase: 79 U/L (ref 39–117)
BUN: 15 mg/dL (ref 6–23)
CO2: 24 mEq/L (ref 19–32)
Calcium: 9.5 mg/dL (ref 8.4–10.5)
Chloride: 109 mEq/L (ref 96–112)
Creatinine, Ser: 0.96 mg/dL (ref 0.40–1.50)
GFR: 95.42 mL/min (ref >60.00)
Glucose, Bld: 91 mg/dL (ref 70–99)
Potassium: 4.2 mEq/L (ref 3.5–5.1)
Sodium: 141 mEq/L (ref 135–145)
Total Bilirubin: 0.4 mg/dL (ref 0.2–1.2)
Total Protein: 6.9 g/dL (ref 6.0–8.3)

## 2022-05-20 LAB — LIPID PANEL
Cholesterol: 198 mg/dL (ref 0–200)
HDL: 31.2 mg/dL — ABNORMAL LOW (ref >39.00)
NonHDL: 167.08
Total CHOL/HDL Ratio: 6
Triglycerides: 253 mg/dL — ABNORMAL HIGH (ref 0.0–149.0)
VLDL: 50.6 mg/dL — ABNORMAL HIGH (ref 0.0–40.0)

## 2022-05-20 LAB — TSH: TSH: 1.91 u[IU]/mL (ref 0.35–5.50)

## 2022-05-20 LAB — PSA: PSA: 0.18 ng/mL (ref 0.10–4.00)

## 2022-05-20 LAB — HEMOGLOBIN A1C: Hgb A1c MFr Bld: 6.1 % (ref 4.6–6.5)

## 2022-05-20 LAB — LDL CHOLESTEROL, DIRECT: Direct LDL: 144 mg/dL

## 2022-05-27 ENCOUNTER — Other Ambulatory Visit: Payer: Self-pay

## 2022-05-27 ENCOUNTER — Encounter: Payer: Self-pay | Admitting: Family Medicine

## 2022-05-27 ENCOUNTER — Ambulatory Visit (INDEPENDENT_AMBULATORY_CARE_PROVIDER_SITE_OTHER): Payer: BC Managed Care – PPO | Admitting: Family Medicine

## 2022-05-27 VITALS — BP 130/85 | HR 74 | Temp 98.0°F | Ht 69.0 in | Wt 230.0 lb

## 2022-05-27 DIAGNOSIS — Z0000 Encounter for general adult medical examination without abnormal findings: Secondary | ICD-10-CM

## 2022-05-27 DIAGNOSIS — E781 Pure hyperglyceridemia: Secondary | ICD-10-CM | POA: Diagnosis not present

## 2022-05-27 DIAGNOSIS — E669 Obesity, unspecified: Secondary | ICD-10-CM

## 2022-05-27 DIAGNOSIS — Z125 Encounter for screening for malignant neoplasm of prostate: Secondary | ICD-10-CM

## 2022-05-27 DIAGNOSIS — R7303 Prediabetes: Secondary | ICD-10-CM | POA: Diagnosis not present

## 2022-05-27 DIAGNOSIS — Z1211 Encounter for screening for malignant neoplasm of colon: Secondary | ICD-10-CM

## 2022-05-27 DIAGNOSIS — F17200 Nicotine dependence, unspecified, uncomplicated: Secondary | ICD-10-CM

## 2022-05-27 DIAGNOSIS — K625 Hemorrhage of anus and rectum: Secondary | ICD-10-CM

## 2022-05-27 MED ORDER — FENOFIBRATE 160 MG PO TABS
160.0000 mg | ORAL_TABLET | Freq: Every day | ORAL | 3 refills | Status: DC
Start: 2022-05-27 — End: 2023-06-29
  Filled 2022-05-27: qty 90, fill #0
  Filled 2022-07-19 (×2): qty 90, 90d supply, fill #0
  Filled 2022-11-28: qty 90, 90d supply, fill #1
  Filled 2023-03-06: qty 90, 90d supply, fill #2

## 2022-05-27 NOTE — Progress Notes (Unsigned)
Subjective:    Patient ID: Jonathan Choi, male    DOB: 1975-04-17, 47 y.o.   MRN: [id number]  HPI Here for health maintenance exam and to review chronic medical problems    Wt Readings from Last 3 Encounters:  05/27/22 230 lb (104.3 kg)  05/18/22 228 lb 8 oz (103.6 kg)  04/13/22 228 lb (103.4 kg)   33.97 kg/m Vitals:   05/27/22 0925 05/27/22 0951  BP: (!) 142/78 130/85  Pulse: 74   Temp: 98 F (36.7 C)   SpO2: 96%     Working a lot  Very long hours  No time for self care   Wt is fairly stable  Has to eat for convenience   Some fruit  Eats salads for supper some times- bothers stomach a bit    Immunization History  Administered Date(s) Administered   Influenza Split 12/15/2011   Influenza,inj,Quad PF,6+ Mos 01/16/2015, 11/30/2015, 12/06/2016, 12/12/2017, 12/24/2018, 12/26/2019, 03/24/2022   Influenza-Unspecified 12/20/2013   Pneumococcal Polysaccharide-23 10/13/2014   Tdap 08/15/2012   Health Maintenance Due  Topic Date Due   COLONOSCOPY (Pts 45-53yr Insurance coverage will need to be confirmed)  Never done    Prostate health  Lab Results  Component Value Date   PSA 0.18 05/20/2022   PSA 0.12 05/17/2021   PSA 0.15 12/24/2019  No change in urination  No nocturia   No fam h/o prostate cancer    Colon cancer screening  Had visit with GI Planning colonoscopy /EGD on 06/07/22  Takes ibuprofen for knee - but not take it around the clock  [facility]    Hyperlipidemia/triglycerides Lab Results  Component Value Date   CHOL 198 05/20/2022   CHOL 203 (H) 06/30/2021   CHOL 233 (H) 05/17/2021   Lab Results  Component Value Date   HDL 31.20 (L) 05/20/2022   HDL 35.10 (L) 06/30/2021   HDL 32.40 (L) 05/17/2021   Lab Results  Component Value Date   LDLCALC 133 (H) 06/30/2021   LDLCALC 129 (H) 11/30/2015   LDLCALC 137 (H) 01/16/2015   Lab Results  Component Value Date   TRIG 253.0 (H) 05/20/2022   TRIG 177.0 (H)  06/30/2021   TRIG 378.0 (H) 05/17/2021   Lab Results  Component Value Date   CHOLHDL 6 05/20/2022   CHOLHDL 6 06/30/2021   CHOLHDL 7 05/17/2021   Lab Results  Component Value Date   LDLDIRECT 144.0 05/20/2022   LDLDIRECT 153.0 05/17/2021   LDLDIRECT 135.0 12/24/2019   Fenofibrate 160 mg daily  Better compliance now with medication   Making some diet change Eats grilled chicken instead of fried  Occ red meat  Getting away from fast food more now- is mindful of it   Cooking more on the grill also     No exercise with work Plans to start walking in evenings -when his knee improves     Prediabetes Lab Results  Component Value Date   HGBA1C 6.1 05/20/2022   This is stable Loves mt dew /soda  Tried the zero sugar   Other labs  Lab Results  Component Value Date   CREATININE 0.96 05/20/2022   BUN 15 05/20/2022   NA 141 05/20/2022   K 4.2 05/20/2022   CL 109 05/20/2022   CO2 24 05/20/2022   Lab Results  Component Value Date   ALT 36 05/20/2022   AST 19 05/20/2022   ALKPHOS 79 05/20/2022   BILITOT 0.4 05/20/2022  Lab Results  Component Value Date   WBC 10.5 05/20/2022   HGB 14.7 05/20/2022   HCT 43.2 05/20/2022   MCV 86.9 05/20/2022   PLT 317.0 05/20/2022    Patient Active Problem List   Diagnosis Date Noted   Special screening for malignant neoplasms, colon 04/13/2022   Early satiety 04/13/2022   Heartburn 04/13/2022   Abdominal bloating 03/24/2022   Liver lesion 06/27/2021   Left shoulder pain 05/24/2021   Encounter for hepatitis C screening test for low risk patient 12/26/2019   Allergic rhinitis 07/15/2019   Prostate cancer screening 11/30/2017   Anal bleeding 07/12/2017   Prediabetes 12/06/2016   Low libido 10/20/2014   Hypertriglyceridemia 10/13/2014   Routine general medical examination at a health care facility 10/07/2014   Smoking 04/14/2014   Obesity (BMI 30-39.9) 04/14/2014   Past Medical History:  Diagnosis Date   Abrasion of  anterior right lower leg 12/22/2015   Dental crown present    Ganglion cyst of wrist, left 12/2015   High triglycerides    Pre-diabetes    Past Surgical History:  Procedure Laterality Date   CHOLECYSTECTOMY, LAPAROSCOPIC  09/2021   KNEE ARTHROSCOPY Right    MASS EXCISION Left 12/29/2015   Procedure: EXCISION MASS, left wrist;  Surgeon: Leanora Cover, MD;  Location: [facility];  Service: Orthopedics;  Laterality: Left;   UMBILICAL HERNIA REPAIR N/A 09/30/2021   Procedure: HERNIA REPAIR UMBILICAL ADULT;  Surgeon: Jules Husbands, MD;  Location: [facility];  Service: General;  Laterality: N/A;   Social History   Tobacco Use   Smoking status: Every Day    Packs/day: 0.50    Years: 15.00    Total pack years: 7.50    Types: Cigarettes   Smokeless tobacco: Former    Types: Chew    Quit date: 1997  Vaping Use   Vaping Use: Never used  Substance Use Topics   Alcohol use: No    Alcohol/week: 0.0 standard drinks of alcohol   Drug use: No   Family History  Problem Relation Age of Onset   Hypertension Mother    Diabetes Father    Colon polyps Father    Pneumonia Father    Lung disease Father    Stroke Maternal Grandfather    Heart attack Paternal Grandmother    Allergies  Allergen Reactions   Cleocin [Clindamycin Hcl] Hives   Current Outpatient Medications on File Prior to Visit  Medication Sig Dispense Refill   Ibuprofen (ADVIL PO) Take by mouth as needed.     Melatonin 10 MG TABS Take 1 tablet by mouth as needed.     Multiple Vitamin (MULTIVITAMIN) tablet Take 1 tablet by mouth daily.     No current facility-administered medications on file prior to visit.       Review of Systems  Constitutional:  Positive for fatigue. Negative for activity change, appetite change, fever and unexpected weight change.  HENT:  Negative for congestion, rhinorrhea, sore throat and trouble swallowing.   Eyes:  Negative for pain, redness, itching and visual disturbance.   Respiratory:  Negative for cough, chest tightness, shortness of breath and wheezing.   Cardiovascular:  Negative for chest pain and palpitations.  Gastrointestinal:  Negative for abdominal pain, blood in stool, constipation, diarrhea and nausea.       Bloating  Endocrine: Negative for cold intolerance, heat intolerance, polydipsia and polyuria.  Genitourinary:  Negative for difficulty urinating, dysuria, frequency and urgency.  Musculoskeletal:  Positive for arthralgias. Negative  for joint swelling and myalgias.  Skin:  Negative for pallor and rash.  Neurological:  Negative for dizziness, tremors, weakness, numbness and headaches.  Hematological:  Negative for adenopathy. Does not bruise/bleed easily.  Psychiatric/Behavioral:  Negative for decreased concentration and dysphoric mood. The patient is not nervous/anxious.        Objective:   Physical Exam Constitutional:      General: He is not in acute distress.    Appearance: Normal appearance. He is well-developed. He is obese. He is not ill-appearing or diaphoretic.  HENT:     Head: Normocephalic and atraumatic.     Right Ear: Tympanic membrane, ear canal and external ear normal.     Left Ear: Tympanic membrane, ear canal and external ear normal.     Nose: Nose normal. No congestion.     Mouth/Throat:     Mouth: Mucous membranes are moist.     Pharynx: Oropharynx is clear. No posterior oropharyngeal erythema.  Eyes:     General: No scleral icterus.       Right eye: No discharge.        Left eye: No discharge.     Conjunctiva/sclera: Conjunctivae normal.     Pupils: Pupils are equal, round, and reactive to light.  Neck:     Thyroid: No thyromegaly.     Vascular: No carotid bruit or JVD.  Cardiovascular:     Rate and Rhythm: Normal rate and regular rhythm.     Pulses: Normal pulses.     Heart sounds: Normal heart sounds.     No gallop.  Pulmonary:     Effort: Pulmonary effort is normal. No respiratory distress.     Breath  sounds: Normal breath sounds. No wheezing or rales.     Comments: Good air exch Chest:     Chest wall: No tenderness.  Abdominal:     General: Bowel sounds are normal. There is no distension or abdominal bruit.     Palpations: Abdomen is soft. There is no mass.     Tenderness: There is no abdominal tenderness.     Hernia: No hernia is present.  Musculoskeletal:        General: No tenderness.     Cervical back: Normal range of motion and neck supple. No rigidity. No muscular tenderness.     Right lower leg: No edema.     Left lower leg: No edema.     Comments: Limited rom L knee  Lymphadenopathy:     Cervical: No cervical adenopathy.  Skin:    General: Skin is warm and dry.     Coloration: Skin is not pale.     Findings: No erythema or rash.     Comments: Solar lentigines diffusely Some solar aging on face/neck  Neurological:     Mental Status: He is alert.     Cranial Nerves: No cranial nerve deficit.     Motor: No abnormal muscle tone.     Coordination: Coordination normal.     Gait: Gait normal.     Deep Tendon Reflexes: Reflexes are normal and symmetric. Reflexes normal.  Psychiatric:        Mood and Affect: Mood normal.        Cognition and Memory: Cognition normal.           Assessment & Plan:   Problem List Items Addressed This Visit       Digestive   Anal bleeding    This is improved Has colonoscopy planned  Other   Hypertriglyceridemia    Disc goals for lipids and reasons to control them Rev last labs with pt Rev low sat fat diet in detail Triglycerides and LDL are up Taking fenofibrate with better compliance  I would rather not add statin unless abs necessary  Disc diet changes  Will monitor  May need inc med man in futur if not imp      Relevant Medications   fenofibrate 160 MG tablet   Obesity (BMI 30-39.9)    Discussed how this problem influences overall health and the risks it imposes  Reviewed plan for weight loss with lower  calorie diet (via better food choices and also portion control or program like weight watchers) and exercise building up to or more than 30 minutes 5 days per week including some aerobic activity        Prediabetes    Lab Results  Component Value Date   HGBA1C 6.1 05/20/2022  disc imp of low glycemic diet and wt loss to prevent DM2  Disc goal of cutting the sugar soda gradually      Prostate cancer screening    Lab Results  Component Value Date   PSA 0.18 05/20/2022   PSA 0.12 05/17/2021   PSA 0.15 12/24/2019   No voiding changes  No fam hx      Routine general medical examination at a health care facility - Primary    Reviewed health habits including diet and exercise and skin cancer prevention Reviewed appropriate screening tests for age  Also reviewed health mt list, fam hx and immunization status , as well as social and family history   See HPI Not much time for self care Stable psa Colonoscopy planned later this month Dealing with knee injury  Disc plan to cut the sugar drinks       Smoking    Disc in detail risks of smoking and possible outcomes including copd, vascular/ heart disease, cancer , respiratory and sinus infections  Pt voices understanding He is not ready      Special screening for malignant neoplasms, colon    Colonoscopy is planned later this month

## 2022-05-27 NOTE — Patient Instructions (Addendum)
Try to get most of your carbohydrates from produce (with the exception of white potatoes)  Eat less bread/pasta/rice/snack foods/cereals/sweets and other items from the middle of the grocery store (processed carbs)  Work on gradually eliminating the sugar soda if you can   Goal is to drink no calorie drinks  You can work on gradually      For cholesterol Avoid red meat/ fried foods/ egg yolks/ fatty breakfast meats/ butter, cheese and high fat dairy/ and shellfish    Message Dr Lorelei Pont about knee braces to see what your options are   Take care of yourself !

## 2022-05-28 NOTE — Assessment & Plan Note (Signed)
Reviewed health habits including diet and exercise and skin cancer prevention Reviewed appropriate screening tests for age  Also reviewed health mt list, fam hx and immunization status , as well as social and family history   See HPI Not much time for self care Stable psa Colonoscopy planned later this month Dealing with knee injury  Disc plan to cut the sugar drinks

## 2022-05-28 NOTE — Assessment & Plan Note (Signed)
Disc goals for lipids and reasons to control them Rev last labs with pt Rev low sat fat diet in detail Triglycerides and LDL are up Taking fenofibrate with better compliance  I would rather not add statin unless abs necessary  Disc diet changes  Will monitor  May need inc med man in futur if not imp

## 2022-05-28 NOTE — Assessment & Plan Note (Signed)
Lab Results  Component Value Date   PSA 0.18 05/20/2022   PSA 0.12 05/17/2021   PSA 0.15 12/24/2019    No voiding changes  No fam hx

## 2022-05-28 NOTE — Assessment & Plan Note (Signed)
Discussed how this problem influences overall health and the risks it imposes  Reviewed plan for weight loss with lower calorie diet (via better food choices and also portion control or program like weight watchers) and exercise building up to or more than 30 minutes 5 days per week including some aerobic activity    

## 2022-05-28 NOTE — Assessment & Plan Note (Signed)
Colonoscopy is planned later this month

## 2022-05-28 NOTE — Assessment & Plan Note (Signed)
Disc in detail risks of smoking and possible outcomes including copd, vascular/ heart disease, cancer , respiratory and sinus infections  Pt voices understanding He is not ready

## 2022-05-28 NOTE — Assessment & Plan Note (Signed)
This is improved Has colonoscopy planned

## 2022-05-28 NOTE — Assessment & Plan Note (Signed)
Lab Results  Component Value Date   HGBA1C 6.1 05/20/2022   disc imp of low glycemic diet and wt loss to prevent DM2  Disc goal of cutting the sugar soda gradually

## 2022-06-07 ENCOUNTER — Other Ambulatory Visit: Payer: Self-pay

## 2022-06-07 ENCOUNTER — Ambulatory Visit (AMBULATORY_SURGERY_CENTER): Payer: BC Managed Care – PPO | Admitting: Internal Medicine

## 2022-06-07 ENCOUNTER — Encounter: Payer: Self-pay | Admitting: Internal Medicine

## 2022-06-07 VITALS — BP 120/78 | HR 72 | Resp 24

## 2022-06-07 DIAGNOSIS — K635 Polyp of colon: Secondary | ICD-10-CM | POA: Diagnosis not present

## 2022-06-07 DIAGNOSIS — D125 Benign neoplasm of sigmoid colon: Secondary | ICD-10-CM

## 2022-06-07 DIAGNOSIS — K2980 Duodenitis without bleeding: Secondary | ICD-10-CM | POA: Diagnosis not present

## 2022-06-07 DIAGNOSIS — K2990 Gastroduodenitis, unspecified, without bleeding: Secondary | ICD-10-CM | POA: Diagnosis not present

## 2022-06-07 DIAGNOSIS — B9681 Helicobacter pylori [H. pylori] as the cause of diseases classified elsewhere: Secondary | ICD-10-CM | POA: Diagnosis not present

## 2022-06-07 DIAGNOSIS — Z1211 Encounter for screening for malignant neoplasm of colon: Secondary | ICD-10-CM

## 2022-06-07 DIAGNOSIS — K259 Gastric ulcer, unspecified as acute or chronic, without hemorrhage or perforation: Secondary | ICD-10-CM

## 2022-06-07 DIAGNOSIS — K2950 Unspecified chronic gastritis without bleeding: Secondary | ICD-10-CM | POA: Diagnosis not present

## 2022-06-07 DIAGNOSIS — R12 Heartburn: Secondary | ICD-10-CM

## 2022-06-07 DIAGNOSIS — K2970 Gastritis, unspecified, without bleeding: Secondary | ICD-10-CM

## 2022-06-07 DIAGNOSIS — K269 Duodenal ulcer, unspecified as acute or chronic, without hemorrhage or perforation: Secondary | ICD-10-CM

## 2022-06-07 MED ORDER — OMEPRAZOLE 40 MG PO CPDR
40.0000 mg | DELAYED_RELEASE_CAPSULE | Freq: Two times a day (BID) | ORAL | 3 refills | Status: DC
  Filled 2022-06-07: qty 90, 45d supply, fill #0
  Filled 2022-07-19: qty 90, 45d supply, fill #1

## 2022-06-07 MED ORDER — HYDROCORTISONE (PERIANAL) 2.5 % EX CREA
1.0000 | TOPICAL_CREAM | Freq: Two times a day (BID) | CUTANEOUS | 0 refills | Status: AC
  Filled 2022-06-07: qty 28, 9d supply, fill #0

## 2022-06-07 MED ORDER — SODIUM CHLORIDE 0.9 % IV SOLN: 500.0000 mL | INTRAVENOUS | Status: DC

## 2022-06-07 NOTE — Op Note (Signed)
[facility] Patient Name: Jonathan Choi Procedure Date: 06/07/2022 1:06 PM MRN: [id number] Endoscopist: Adline Mango [place] , , [id number] Age: 47 Referring MD:  Date of Birth: 05-Oct-1975 Gender: Male Account #: [id number] Procedure:                Upper GI endoscopy Indications:              Heartburn, Abdominal bloating, Early satiety Medicines:                Monitored Anesthesia Care Procedure:                Pre-Anesthesia Assessment:                           - Prior to the procedure, a History and Physical                            was performed, and patient medications and                            allergies were reviewed. The patient's tolerance of                            previous anesthesia was also reviewed. The risks                            and benefits of the procedure and the sedation                            options and risks were discussed with the patient.                            All questions were answered, and informed consent                            was obtained. Prior Anticoagulants: The patient has                            taken no anticoagulant or antiplatelet agents. ASA                            Grade Assessment: II - A patient with mild systemic                            disease. After reviewing the risks and benefits,                            the patient was deemed in satisfactory condition to                            undergo the procedure.                           After obtaining informed consent, the endoscope was  passed under direct vision. Throughout the                            procedure, the patient's blood pressure, pulse, and                            oxygen saturations were monitored continuously. The                            GIF [id number] was introduced through the                            mouth, and advanced to the second part of duodenum.                            The  upper GI endoscopy was accomplished without                            difficulty. The patient tolerated the procedure                            well. Scope In: Scope Out: Findings:                 White nummular lesions were noted in the distal                            esophagus. Biopsies were taken with a cold forceps                            for histology.                           Localized inflammation characterized by congestion                            (edema), erosions and erythema was found in the                            gastric body and in the gastric antrum. Biopsies                            were taken with a cold forceps for histology.                           Localized inflammation characterized by congestion                            (edema), erosions and erythema was found in the                            duodenal bulb. Biopsies were taken with a cold                            forceps for histology. Complications:  No immediate complications. Estimated Blood Loss:     Estimated blood loss was minimal. Impression:               - White nummular lesions in esophageal mucosa.                            Biopsied.                           - Gastritis. Biopsied.                           - Duodenitis. Biopsied. Recommendation:           - Await pathology results.                           - Use Prilosec (omeprazole) 40 mg PO BID for 8                            weeks.                           - Avoid NSAID use if possible.                           - Perform a colonoscopy today. Dr Georgian Co "[name]" Lorenso Courier,  06/07/2022 2:11:14 PM

## 2022-06-07 NOTE — Progress Notes (Signed)
Called to room to assist during endoscopic procedure.  Patient ID and intended procedure confirmed with present staff. Received instructions for my participation in the procedure from the performing physician.  

## 2022-06-07 NOTE — Patient Instructions (Addendum)
Handout on gastritis, polyps, and hemorrhoids given to patient.  Await pathology results. Repeat colonoscopy for surveillance will be determined based off of pathology results. Pick up prescription for Prilosec (omeprazole) 40 mg twice a day for 8 weeks and Anusol HC cream twice a day for 7 days from [place]  Avoid NSAID use if possible (examples are aspirin, ibuprofen, naproxen, or any other non-steroidal anti-inflammatory medications) Return to office for follow up appointment in 2 months with Dr. Lorenso Courier for heartburn and bloating - earliest appointment available is Thursday June 6th, 2024 @ 9:50 am (if this time/date doesn't work for you call the office to reschedule)    YOU HAD AN ENDOSCOPIC PROCEDURE TODAY AT [place]:   Refer to the procedure report that was given to you for any specific questions about what was found during the examination.  If the procedure report does not answer your questions, please call your gastroenterologist to clarify.  If you requested that your care partner not be given the details of your procedure findings, then the procedure report has been included in a sealed envelope for you to review at your convenience later.  YOU SHOULD EXPECT: Some feelings of bloating in the abdomen. Passage of more gas than usual.  Walking can help get rid of the air that was put into your GI tract during the procedure and reduce the bloating. If you had a lower endoscopy (such as a colonoscopy or flexible sigmoidoscopy) you may notice spotting of blood in your stool or on the toilet paper. If you underwent a bowel prep for your procedure, you may not have a normal bowel movement for a few days.  Please Note:  You might notice some irritation and congestion in your nose or some drainage.  This is from the oxygen used during your procedure.  There is no need for concern and it should clear up in a day or so.  SYMPTOMS TO  REPORT IMMEDIATELY:  Following lower endoscopy (colonoscopy or flexible sigmoidoscopy):  Excessive amounts of blood in the stool  Significant tenderness or worsening of abdominal pains  Swelling of the abdomen that is new, acute  Fever of 100F or higher  Following upper endoscopy (EGD)  Vomiting of blood or coffee ground material  New chest pain or pain under the shoulder blades  Painful or persistently difficult swallowing  New shortness of breath  Fever of 100F or higher  Black, tarry-looking stools  For urgent or emergent issues, a gastroenterologist can be reached at any hour by calling [phone]. Do not use MyChart messaging for urgent concerns.    DIET:  We do recommend a small meal at first, but then you may proceed to your regular diet.  Drink plenty of fluids but you should avoid alcoholic beverages for 24 hours.  ACTIVITY:  You should plan to take it easy for the rest of today and you should NOT DRIVE or use heavy machinery until tomorrow (because of the sedation medicines used during the test).    FOLLOW UP: Our staff will call the number listed on your records the next business day following your procedure.  We will call around 7:15- 8:00 am to check on you and address any questions or concerns that you may have regarding the information given to you following your procedure. If we do not reach you, we will leave a message.     If any biopsies were taken you will be contacted by  phone or by letter within the next 1-3 weeks.  Please call us at [phone] if you have not heard about the biopsies in 3 weeks.    SIGNATURES/CONFIDENTIALITY: You and/or your care partner have signed paperwork which will be entered into your electronic medical record.  These signatures attest to the fact that that the information above on your After Visit Summary has been reviewed and is understood.  Full responsibility of the confidentiality of this discharge information lies with you  and/or your care-partner.

## 2022-06-07 NOTE — Progress Notes (Signed)
A/O x 3, gd SR's, VSS, report to RN

## 2022-06-07 NOTE — Progress Notes (Signed)
GASTROENTEROLOGY PROCEDURE H&P NOTE   Primary Care Physician: Tower, Wynelle Fanny, MD    Reason for Procedure:   GERD, bloating, early satiety, colon cancer screening  Plan:    EGD/colonoscopy  Patient is appropriate for endoscopic procedure(s) in the ambulatory ([facility]) setting.  The nature of the procedure, as well as the risks, benefits, and alternatives were carefully and thoroughly reviewed with the patient. Ample time for discussion and questions allowed. The patient understood, was satisfied, and agreed to proceed.     HPI: Jonathan Choi is a 47 y.o. male who presents for EGD/colonoscopy for evaluation of GERD, bloating, and colon cancer screening.  Patient was most recently seen in the Gastroenterology Clinic on 04/13/22.  No interval change in medical history since that appointment. Please refer to that note for full details regarding GI history and clinical presentation.   Past Medical History:  Diagnosis Date   Abrasion of anterior right lower leg 12/22/2015   Dental crown present    Ganglion cyst of wrist, left 12/2015   High triglycerides    Pre-diabetes     Past Surgical History:  Procedure Laterality Date   CHOLECYSTECTOMY, LAPAROSCOPIC  09/2021   KNEE ARTHROSCOPY Right    MASS EXCISION Left 12/29/2015   Procedure: EXCISION MASS, left wrist;  Surgeon: Leanora Cover, MD;  Location: [facility];  Service: Orthopedics;  Laterality: Left;   UMBILICAL HERNIA REPAIR N/A 09/30/2021   Procedure: HERNIA REPAIR UMBILICAL ADULT;  Surgeon: Jules Husbands, MD;  Location: [facility];  Service: General;  Laterality: N/A;    Prior to Admission medications   Medication Sig Start Date End Date Taking? Authorizing Provider  acetaminophen (TYLENOL) 500 MG tablet Take 500 mg by mouth every 6 (six) hours as needed for moderate pain. 06/06/22  Yes [provider]  fenofibrate 160 MG tablet Take 1 tablet (160 mg total) by mouth daily. 05/27/22 05/27/23 Yes Tower, Wynelle Fanny,  MD  Ibuprofen (ADVIL PO) Take by mouth as needed.   Yes [provider]  Melatonin 10 MG TABS Take 1 tablet by mouth as needed.   Yes [provider]  Multiple Vitamin (MULTIVITAMIN) tablet Take 1 tablet by mouth daily.   Yes [provider]    Current Outpatient Medications  Medication Sig Dispense Refill   acetaminophen (TYLENOL) 500 MG tablet Take 500 mg by mouth every 6 (six) hours as needed for moderate pain.     fenofibrate 160 MG tablet Take 1 tablet (160 mg total) by mouth daily. 90 tablet 3   Ibuprofen (ADVIL PO) Take by mouth as needed.     Melatonin 10 MG TABS Take 1 tablet by mouth as needed.     Multiple Vitamin (MULTIVITAMIN) tablet Take 1 tablet by mouth daily.     Current Facility-Administered Medications  Medication Dose Route Frequency Provider Last Rate Last Admin   0.9 %  sodium chloride infusion  500 mL Intravenous Continuous Sharyn Creamer, MD        Allergies as of 06/07/2022 - Review Complete 06/07/2022  Allergen Reaction Noted   Cleocin [clindamycin hcl] Hives 10/13/2014    Family History  Problem Relation Age of Onset   Hypertension Mother    Diabetes Father    Colon polyps Father    Pneumonia Father    Lung disease Father    Stroke Maternal Grandfather    Heart attack Paternal Grandmother    Esophageal cancer Neg Hx    Rectal cancer Neg Hx  Stomach cancer Neg Hx     Social History   Socioeconomic History   Marital status: Married    Spouse name: Melissa   Number of children: 3   Years of education: Not on file   Highest education level: Not on file  Occupational History   Occupation: welder  Tobacco Use   Smoking status: Every Day    Packs/day: 0.50    Years: 15.00    Additional pack years: 0.00    Total pack years: 7.50    Types: Cigarettes   Smokeless tobacco: Former    Types: Chew    Quit date: 1997  Vaping Use   Vaping Use: Never used  Substance and Sexual Activity   Alcohol use: No     Alcohol/week: 0.0 standard drinks of alcohol   Drug use: No   Sexual activity: Not on file  Other Topics Concern   Not on file  Social History Narrative   Married    3 children    Works in Patent attorney.   Social Determinants of Health   Financial Resource Strain: Not on file  Food Insecurity: Not on file  Transportation Needs: Not on file  Physical Activity: Not on file  Stress: Not on file  Social Connections: Not on file  Intimate Partner Violence: Not on file    Physical Exam: Vital signs in last 24 hours: There were no vitals taken for this visit. GEN: NAD EYE: Sclerae anicteric ENT: MMM CV: Non-tachycardic Pulm: No increased WOB GI: Soft NEURO:  Alert & Oriented   Christia Reading, MD [facility]   06/07/2022 1:38 PM

## 2022-06-07 NOTE — Op Note (Signed)
[facility] Patient Name: Jonathan Choi Procedure Date: 06/07/2022 1:05 PM MRN: [id number] Endoscopist: Georgian Co , , [id number] Age: 47 Referring MD:  Date of Birth: 1975/07/21 Gender: Male Account #: [id number] Procedure:                Colonoscopy Indications:              Screening for colorectal malignant neoplasm Medicines:                Monitored Anesthesia Care Procedure:                Pre-Anesthesia Assessment:                           - Prior to the procedure, a History and Physical                            was performed, and patient medications and                            allergies were reviewed. The patient's tolerance of                            previous anesthesia was also reviewed. The risks                            and benefits of the procedure and the sedation                            options and risks were discussed with the patient.                            All questions were answered, and informed consent                            was obtained. Prior Anticoagulants: The patient has                            taken no anticoagulant or antiplatelet agents. ASA                            Grade Assessment: II - A patient with mild systemic                            disease. After reviewing the risks and benefits,                            the patient was deemed in satisfactory condition to                            undergo the procedure.                           After obtaining informed consent, the colonoscope  was passed under direct vision. Throughout the                            procedure, the patient's blood pressure, pulse, and                            oxygen saturations were monitored continuously. The                            CF [id number] was introduced through the anus                            and advanced to the the terminal ileum. The                            colonoscopy was  performed without difficulty. The                            patient tolerated the procedure well. The quality                            of the bowel preparation was good. The terminal                            ileum, ileocecal valve, appendiceal orifice, and                            rectum were photographed. Scope In: 1:51:44 PM Scope Out: 2:07:39 PM Scope Withdrawal Time: 0 hours 12 minutes 16 seconds  Total Procedure Duration: 0 hours 15 minutes 55 seconds  Findings:                 The terminal ileum appeared normal.                           Four sessile polyps were found in the sigmoid                            colon. The polyps were 3 to 6 mm in size. These                            polyps were removed with a cold snare. Resection                            and retrieval were complete.                           Bleeding internal hemorrhoids were found during                            perianal exam. The hemorrhoids were Grade IV                            (internal hemorrhoids that prolapse and cannot be  reduced manually). Complications:            No immediate complications. Estimated Blood Loss:     Estimated blood loss was minimal. Impression:               - The examined portion of the ileum was normal.                           - Four 3 to 6 mm polyps in the sigmoid colon,                            removed with a cold snare. Resected and retrieved.                           - Bleeding internal hemorrhoids. Recommendation:           - Discharge patient to home (with escort).                           - Await pathology results.                           - Anusol HC cream BID for 7 days.                           - Return to GI clinic in 2 months to follow up on                            heartburn and bloating.                           - The findings and recommendations were discussed                            with the patient. Dr Georgian Co "[name]" Lorenso Courier,  06/07/2022 2:15:20 PM

## 2022-06-07 NOTE — Progress Notes (Signed)
Pt's states no medical or surgical changes since previsit or office visit. 

## 2022-06-08 ENCOUNTER — Telehealth: Payer: Self-pay

## 2022-06-08 NOTE — Telephone Encounter (Signed)
  Follow up Call-     06/07/2022    1:15 PM  Call back number  Post procedure Call Back phone  # [phone]  Permission to leave phone message Yes     Patient questions:  Do you have a fever, pain , or abdominal swelling? No. Pain Score  0 *  Have you tolerated food without any problems? Yes.    Have you been able to return to your normal activities? Yes.    Do you have any questions about your discharge instructions: Diet   No. Medications  No. Follow up visit  No.  Do you have questions or concerns about your Care? No.  Actions: * If pain score is 4 or above: No action needed, pain <4.

## 2022-06-13 ENCOUNTER — Encounter: Payer: Self-pay | Admitting: Internal Medicine

## 2022-06-13 NOTE — Progress Notes (Signed)
Hi Beth, please let the patient know that gastric biopsies pathology came back positive for H pylori gastritis. Recommend bismuth quadruple therapy for treatment: - Tetracycline 500 mg QID x 14 days - Flagyl 250 mg QID x 14 days - Bismuth subsalicylate [id number] mg QID x 14 days - PPI BID x 14 days  He will need to be tested for eradication of the H pylori infection in the future, which we can discuss during his next clinic appointment.

## 2022-06-15 ENCOUNTER — Other Ambulatory Visit: Payer: Self-pay

## 2022-06-15 MED ORDER — BISMUTH SUBSALICYLATE 262 MG PO TABS
2.0000 | ORAL_TABLET | Freq: Four times a day (QID) | ORAL | 0 refills | Status: AC
  Filled 2022-06-15: qty 112, 14d supply, fill #0

## 2022-06-15 MED ORDER — TETRACYCLINE HCL 500 MG PO CAPS
500.0000 mg | ORAL_CAPSULE | Freq: Four times a day (QID) | ORAL | 0 refills | Status: AC
  Filled 2022-06-15: qty 56, 14d supply, fill #0

## 2022-06-15 MED ORDER — METRONIDAZOLE 250 MG PO TABS
250.0000 mg | ORAL_TABLET | Freq: Four times a day (QID) | ORAL | 0 refills | Status: AC
  Filled 2022-06-15: qty 56, 14d supply, fill #0

## 2022-06-16 ENCOUNTER — Other Ambulatory Visit: Payer: Self-pay

## 2022-07-19 ENCOUNTER — Other Ambulatory Visit: Payer: Self-pay

## 2022-08-25 ENCOUNTER — Encounter: Payer: Self-pay | Admitting: Internal Medicine

## 2022-08-25 ENCOUNTER — Ambulatory Visit: Payer: BC Managed Care – PPO | Admitting: Internal Medicine

## 2022-08-25 ENCOUNTER — Ambulatory Visit: Payer: BC Managed Care – PPO

## 2022-08-25 ENCOUNTER — Other Ambulatory Visit: Payer: Self-pay

## 2022-08-25 VITALS — BP 136/80 | HR 83 | Ht 69.0 in | Wt 230.0 lb

## 2022-08-25 DIAGNOSIS — A048 Other specified bacterial intestinal infections: Secondary | ICD-10-CM | POA: Diagnosis not present

## 2022-08-25 DIAGNOSIS — K219 Gastro-esophageal reflux disease without esophagitis: Secondary | ICD-10-CM

## 2022-08-25 DIAGNOSIS — K2970 Gastritis, unspecified, without bleeding: Secondary | ICD-10-CM | POA: Diagnosis not present

## 2022-08-25 DIAGNOSIS — K269 Duodenal ulcer, unspecified as acute or chronic, without hemorrhage or perforation: Secondary | ICD-10-CM | POA: Diagnosis not present

## 2022-08-25 DIAGNOSIS — K2990 Gastroduodenitis, unspecified, without bleeding: Secondary | ICD-10-CM

## 2022-08-25 DIAGNOSIS — K259 Gastric ulcer, unspecified as acute or chronic, without hemorrhage or perforation: Secondary | ICD-10-CM

## 2022-08-25 MED ORDER — OMEPRAZOLE 40 MG PO CPDR
40.0000 mg | DELAYED_RELEASE_CAPSULE | Freq: Every day | ORAL | 0 refills | Status: DC
  Filled 2022-08-25: qty 30, 30d supply, fill #0

## 2022-08-25 NOTE — Progress Notes (Signed)
08/25/2022 Jonathan Choi [id number] Jul 27, 1975   HISTORY OF PRESENT ILLNESS: This is a pleasant 47 year old male with history of H pylori gastritis presents for follow up of H pylori   Interval History: He has been doing well. He completed his complete course of H pylori therapy. Denies any abdominal pain. His heartburn, early satiety, and bloating have all resolved. He has been taking his Prilosec 40 mg twice daily.   Outpatient Encounter Medications as of 08/25/2022  Medication Sig   acetaminophen (TYLENOL) 500 MG tablet Take 500 mg by mouth every 6 (six) hours as needed for moderate pain.   fenofibrate 160 MG tablet Take 1 tablet (160 mg total) by mouth daily.   Ibuprofen (ADVIL PO) Take by mouth as needed.   Melatonin 10 MG TABS Take 1 tablet by mouth as needed.   Multiple Vitamin (MULTIVITAMIN) tablet Take 1 tablet by mouth daily.   omeprazole (PRILOSEC) 40 MG capsule Take 1 capsule (40 mg total) by mouth 2 (two) times daily.   No facility-administered encounter medications on file as of 08/25/2022.   PHYSICAL EXAM: BP 136/80   Pulse 83   Ht 5\' 9"  (1.753 m)   Wt 230 lb (104.3 kg)   BMI 33.97 kg/m  General: Well developed white male in no acute distress Head: Normocephalic and atraumatic Lungs: Clear throughout to auscultation; no W/R/R. Heart: Regular rate and rhythm; no M/R/G. Abdomen: Soft, non-distended.  BS present.  Non-tender. Rectal:  Will be done at the time of colonoscopy. Musculoskeletal: Symmetrical with no gross deformities  Skin: No lesions on visible extremities Extremities: No edema  Neurological: Alert oriented x 4, grossly non-focal Psychological:  Alert and cooperative. Normal mood and affect  Labs 05/2022: CBC nml. CMP nml. TSH nml. HbA1C 6.1%.   CT A/P w/contrast 06/23/21: IMPRESSION: Fat containing umbilical hernia with a 1.5 cm aperture and mild stranding within the herniated fat. Correlate with umbilical pain and reducibility. No bowel  containing hernia. No other acute findings in the abdomen and pelvis. The appendix is normal. Right posterior basilar pleural thickening with adjacent rounded consolidation consistent with rounded atelectasis. Correlate with any prior history of right basilar pleuroparenchymal disease. Indeterminate 2.7 cm hypodense lesion in the right hepatic dome, likely a hemangioma or cyst in the absence of known malignancy. Recommend non-emergent liver protocol MRI with and without contrast for further evaluation.  MRI abdomen w/contrast 07/30/21: IMPRESSION: 1. Hepatomegaly and hepatic steatosis. 2. Multiple hepatic hemangiomas, largest at the dome of the right hepatic lobe. 3. Contracted gallbladder with diffusely thickened and enhancing walls, as seen on previous CT, consistent with chronic cholecystitis. Surgical consultation recommended. 4. Umbilical hernia containing fat.  EGD 06/07/22:  Path: 1. Surgical [P], duodenal (erosions) - DUODENAL MUCOSA WITH BRUNNER'S GLANDS HYPERPLASIA AND EXTENSIVE FOVEOLAR METAPLASIA WITH REACTIVE/REPARATIVE CHANGE CONSISTENT WITH CHRONIC PEPTIC DUODENITIS. 2. Surgical [P], gastric (gastritis /erosions) - ANTRAL AND OXYNTIC MUCOSA WITH MODERATE CHRONIC FOCAL MINIMALLY ACTIVE HELICOBACTER ASSOCIATED GASTRITIS. - HELICOBACTER PYLORI ORGANISMS ARE IDENTIFIED ON THE H&E STAINED SLIDE. 3. Surgical [P], esophagus - SQUAMOUS MUCOSA WITH NO SIGNIFICANT PATHOLOGY.  Colonoscopy 06/07/22:  Path: 4. Surgical [P], colon, sigmoid, polyp (4) - HYPERPLASTIC POLYPS SOME WITH EVIDENCE OF PROLAPSE EFFECT.  ASSESSMENT AND PLAN: H pylori gastritis GERD Fatty liver Patient presents for follow up of H pylori gastritis after he completed treatment. He has been maintained on PPI BID for now. His symptoms of early satiety, bloating, and GERD have resolved. Will check for eradication of H pylori  and attempt to decrease his omeprazole therapy to the lowest effective dose. FIB-4  for his fatty liver is 0.45, suggesting against advanced fibrosis. - Check Diatherix H pylori stool to confirm H pylori eradication - Dec PPI from BID to QD for 4 weeks, then stop. If symptoms return, can restart PPI in the future - Next colonoscopy due in 05/2032 for colon cancer screening - RTC PRN  I spent 32 minutes of time, including in depth chart review, independent review of results as outlined above, communicating results with the patient directly, face-to-face time with the patient, coordinating care, and ordering studies and medications as appropriate, and documentation.

## 2022-08-25 NOTE — Patient Instructions (Addendum)
Your provider has ordered "Diatherix" stool testing for you. You have received a kit from our office today containing all necessary supplies to complete this test. Please carefully read the stool collection instructions provided in the kit before opening the accompanying materials. In addition, be sure to place the label from the top left corner of the laboratory request sheet onto the "puritan opti-swab" tube that is supplied in the kit. This label should include your full name and date of birth. After completing the test, you should secure the purtian tube into the specimen biohazard bag. The laboratory request information sheet (including date and time of specimen collection) should be placed into the outside pocket of the specimen biohazard bag and returned to the [facility] lab with 2 days of collection.    We have sent the following medications to your pharmacy for you to pick up at your convenience: Omeprazole  _______________________________________________________  If your blood pressure at your visit was 140/90 or greater, please contact your primary care physician to follow up on this.  _______________________________________________________  If you are age 42 or older, your body mass index should be between 23-30. Your Body mass index is 33.97 kg/m. If this is out of the aforementioned range listed, please consider follow up with your Primary Care Provider.  If you are age 68 or younger, your body mass index should be between 19-25. Your Body mass index is 33.97 kg/m. If this is out of the aformentioned range listed, please consider follow up with your Primary Care Provider.   ________________________________________________________  The [facility] providers would like to encourage you to use [facility] to communicate with providers for non-urgent requests or questions.  Due to long hold times on the telephone, sending your provider a message by [facility] may be a faster and more efficient way  to get a response.  Please allow 48 business hours for a response.  Please remember that this is for non-urgent requests.  _______________________________________________________   Due to recent changes in healthcare laws, you may see the results of your imaging and laboratory studies on MyChart before your provider has had a chance to review them.  We understand that in some cases there may be results that are confusing or concerning to you. Not all laboratory results come back in the same time frame and the provider may be waiting for multiple results in order to interpret others.  Please give [place] 48 hours in order for your provider to thoroughly review all the results before contacting the office for clarification of your results.    Thank you for entrusting me with your care and for choosing [facility], Dr. Eulah Pont

## 2022-08-29 ENCOUNTER — Encounter: Payer: Self-pay | Admitting: Internal Medicine

## 2022-08-29 NOTE — Progress Notes (Signed)
H pylori stool test was negative. This suggests that he has cleared his H pylori infection. This is good news! Tisha, please let him know about these results.

## 2022-09-01 ENCOUNTER — Telehealth: Payer: Self-pay

## 2022-09-01 NOTE — Telephone Encounter (Signed)
Spoke to patient per provider H pylori stool test was negative. This suggests that he is cleared from his H pylori infection. Patient verbalized understanding and had no additional questions

## 2023-03-06 ENCOUNTER — Other Ambulatory Visit: Payer: Self-pay

## 2023-05-26 ENCOUNTER — Other Ambulatory Visit: Payer: BC Managed Care – PPO

## 2023-06-02 ENCOUNTER — Encounter: Payer: BC Managed Care – PPO | Admitting: Family Medicine

## 2023-06-29 ENCOUNTER — Other Ambulatory Visit: Payer: Self-pay | Admitting: Family Medicine

## 2023-06-29 ENCOUNTER — Other Ambulatory Visit: Payer: Self-pay

## 2023-06-29 MED FILL — Fenofibrate Tab 160 MG: ORAL | 30 days supply | Qty: 30 | Fill #0 | Status: AC

## 2023-06-29 NOTE — Telephone Encounter (Signed)
 Spoke to pt, pt states due to his work schedule, he'll be leaving to go out of town next week & won't return until a few months. Pt states he'll call back to schedule.

## 2023-06-29 NOTE — Telephone Encounter (Signed)
 Pt need to r/s his CPE (labs prior), please schedule  Med refilled once (30 day Rx)

## 2023-07-04 IMAGING — MR MR ABDOMEN WO/W CM
20 series · 48 of 48 positions shown · IV contrast (10 GADAVIST)
Comparison: CT abdomen and pelvis 06/23/2021

CLINICAL DATA: Liver lesion

EXAM:
MRI ABDOMEN WITHOUT AND WITH CONTRAST
TECHNIQUE: Multiplanar multisequence MR imaging of the abdomen was performed
both before and after the administration of intravenous contrast.
CONTRAST:  10mL GADAVIST GADOBUTROL 1 MMOL/ML IV SOLN

[Series 3: T2 · coronal · 6.0mm · 1.56mm/px · 1 of 40 slices shown (1 of 2)]
[im 1/40]
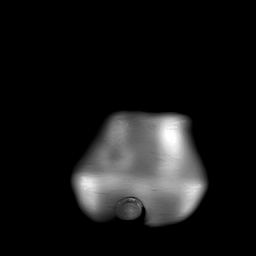

[Series 4: T2 fat-sat · axial · 6.0mm · 1.25mm/px · 1 of 41 slices shown]
[im 1/41]
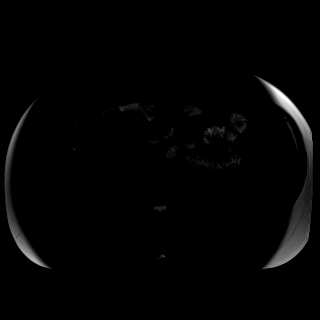

[Series 6: DWI · axial · 6.0mm · 1.49mm/px · z∈[-170,+126]mm · 2 of 84 slices shown (1 of 2)]
[im 1/84]
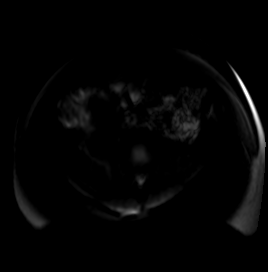
[im 84/84]
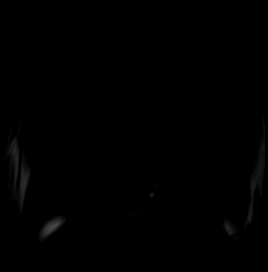

[Series 7: DWI · axial · 6.0mm · 1.49mm/px · 1 of 42 slices shown (2 of 2)]
[im 1/42]
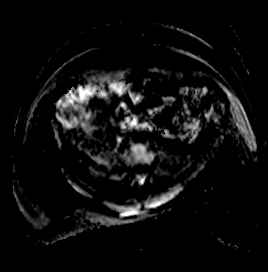

[Series 8: T1 · axial · 3.0mm · 1.25mm/px · z∈[-144,+117]mm · 2 of 88 slices shown (1 of 2)]
[im 1/88]
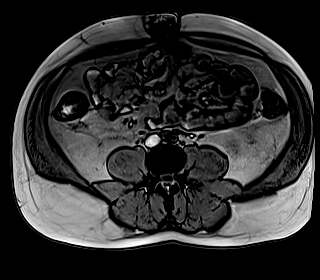
[im 88/88]
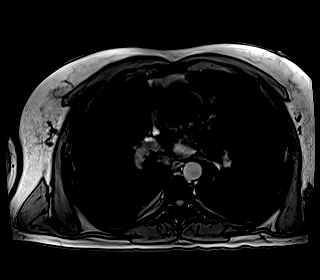

[Series 9: T1 · axial · 3.0mm · 1.25mm/px · z∈[-144,+117]mm · 3 of 88 slices shown (2 of 2)]
[im 1/88]
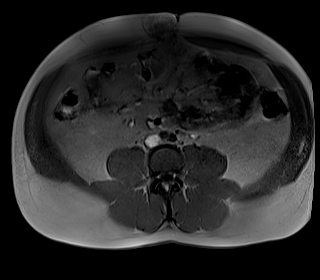
[im 44/88]
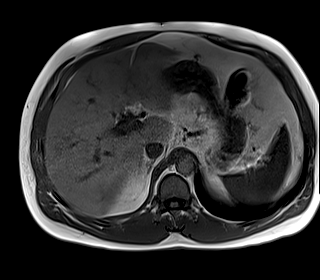
[im 88/88]
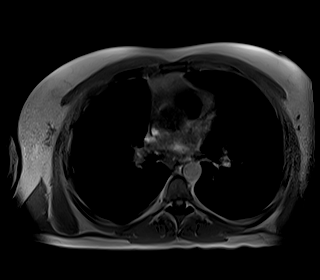

[Series 10: bSSFP · axial · 4.0mm · 0.84mm/px · z∈[-165,+115]mm · 2 of 71 slices shown]
[im 1/71]
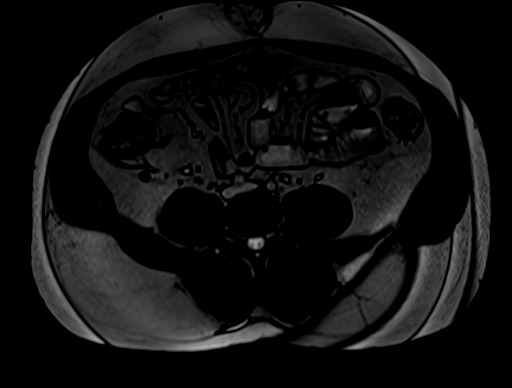
[im 71/71]
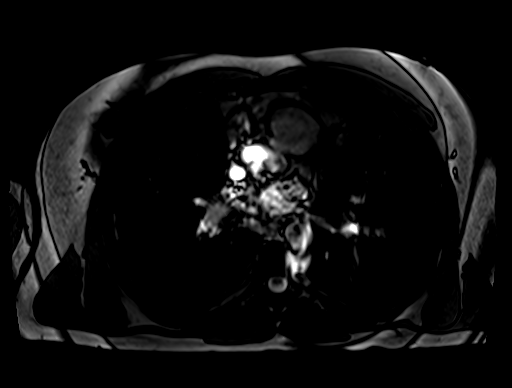

[Series 12: T1 dynamic · axial · 3.0mm · 1.25mm/px · z∈[-166,+119]mm · 3 of 96 slices shown (1 of 12)]
[im 1/96]
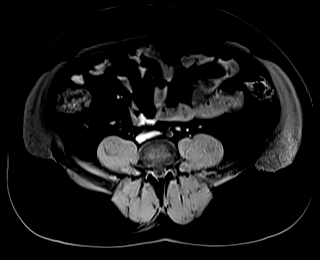
[im 48/96]
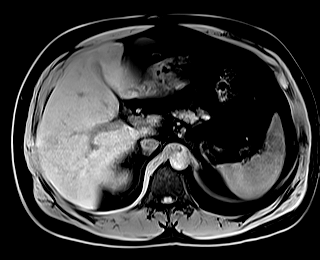
[im 96/96]
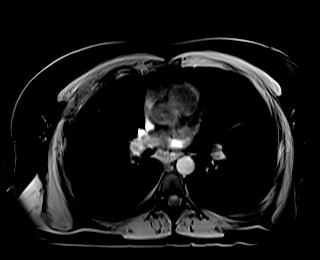

[Series 16: T1 dynamic · axial · 3.0mm · 1.25mm/px · z∈[-166,+119]mm · 3 of 96 slices shown (2 of 12)]
[im 1/96]
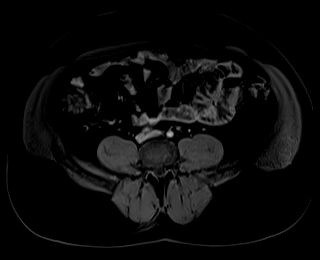
[im 48/96]
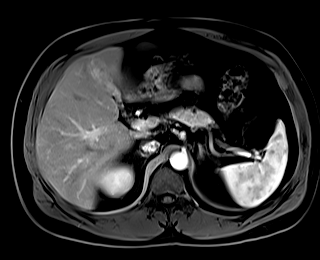
[im 96/96]
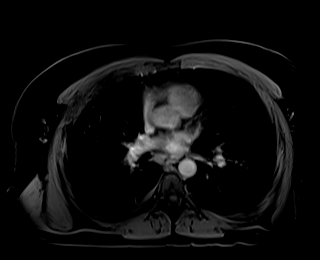

[Series 17: T1 dynamic · axial · 3.0mm · 1.25mm/px · z∈[-166,+119]mm · 3 of 96 slices shown (3 of 12)]
[im 1/96]
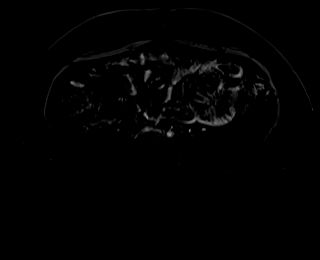
[im 48/96]
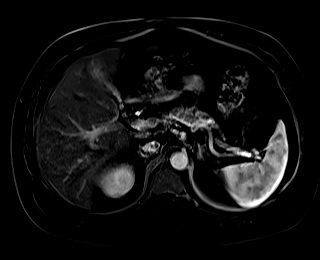
[im 96/96]
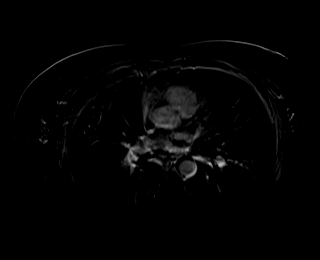

[Series 20: T1 dynamic · axial · 3.0mm · 1.25mm/px · z∈[-166,+119]mm · 3 of 96 slices shown (4 of 12)]
[im 1/96]
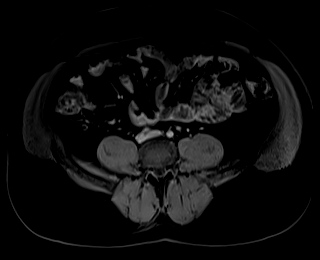
[im 48/96]
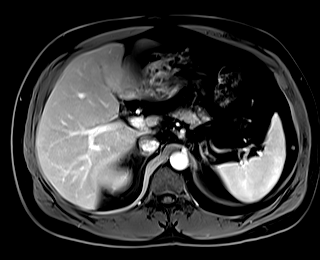
[im 96/96]
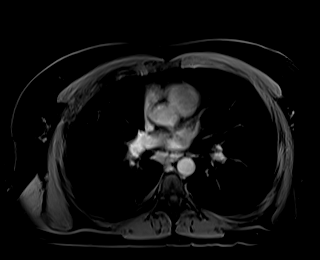

[Series 21: T1 dynamic · axial · 3.0mm · 1.25mm/px · z∈[-166,+119]mm · 3 of 96 slices shown (5 of 12)]
[im 1/96]
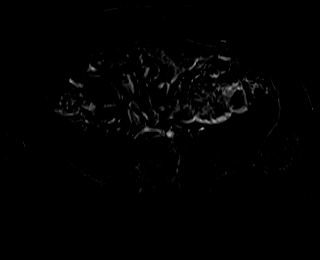
[im 48/96]
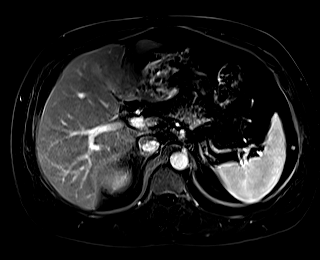
[im 96/96]
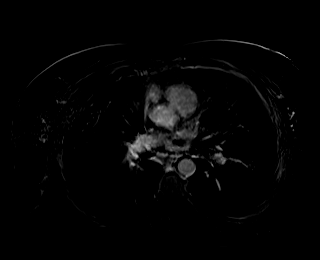

[Series 24: T1 dynamic · axial · 3.0mm · 1.25mm/px · z∈[-166,+119]mm · 3 of 96 slices shown (6 of 12)]
[im 1/96]
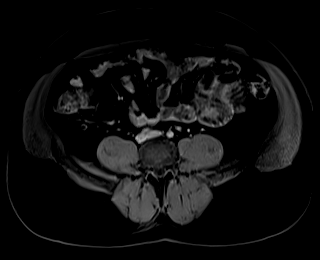
[im 48/96]
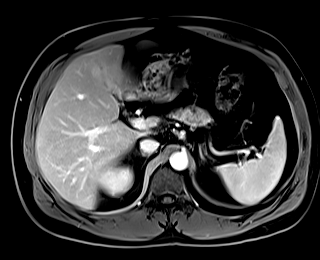
[im 96/96]
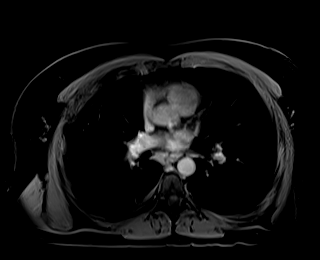

[Series 25: T1 dynamic · axial · 3.0mm · 1.25mm/px · z∈[-166,+119]mm · 3 of 96 slices shown (7 of 12)]
[im 1/96]
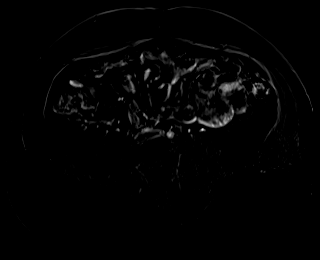
[im 48/96]
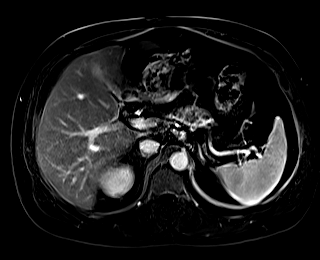
[im 96/96]
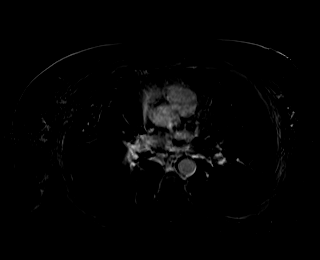

[Series 27: T1 dynamic · coronal · 5.0mm · 1.41mm/px · 2 of 60 slices shown (8 of 12)]
[im 1/60]
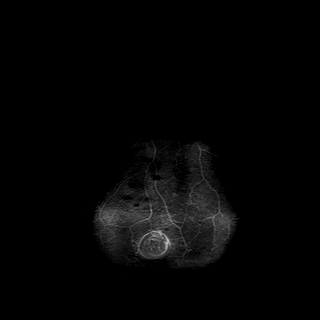
[im 60/60]
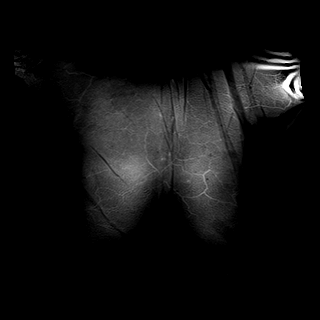

[Series 28: T2 · axial · 6.0mm · 1.56mm/px · 1 of 42 slices shown (2 of 2)]
[im 1/42]
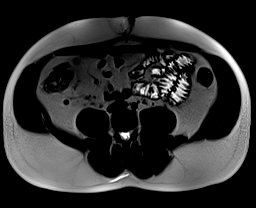

[Series 31: T1 dynamic · axial · 3.0mm · 1.25mm/px · z∈[-166,+119]mm · 3 of 96 slices shown (9 of 12)]
[im 1/96]
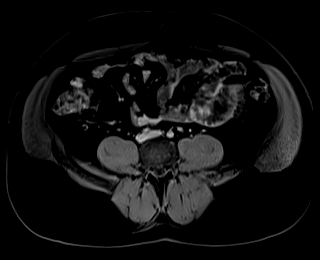
[im 48/96]
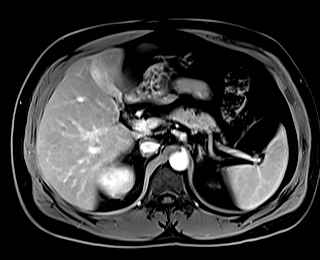
[im 96/96]
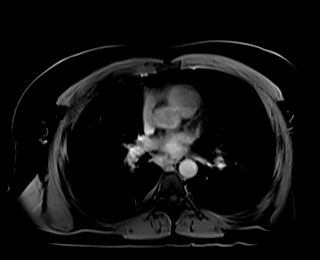

[Series 32: T1 dynamic · axial · 3.0mm · 1.25mm/px · z∈[-166,+119]mm · 3 of 96 slices shown (10 of 12)]
[im 1/96]
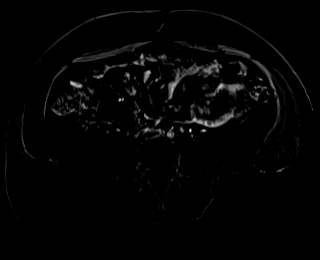
[im 48/96]
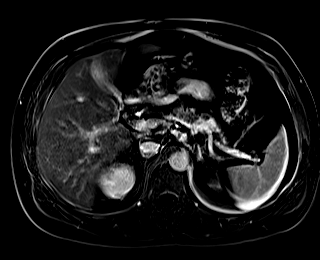
[im 96/96]
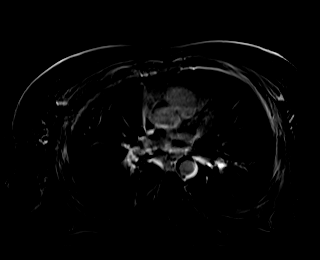

[Series 35: T1 dynamic · axial · 3.0mm · 1.25mm/px · z∈[-166,+119]mm · 3 of 96 slices shown (11 of 12)]
[im 1/96]
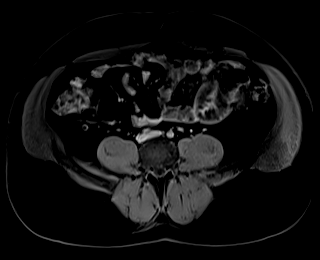
[im 48/96]
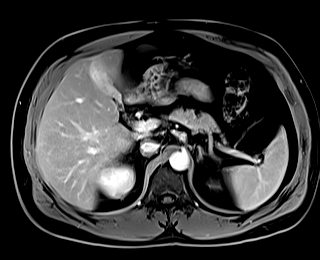
[im 96/96]
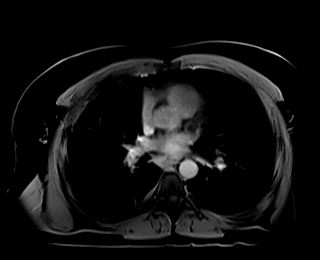

[Series 36: T1 dynamic · axial · 3.0mm · 1.25mm/px · z∈[-166,+119]mm · 3 of 96 slices shown (12 of 12)]
[im 1/96]
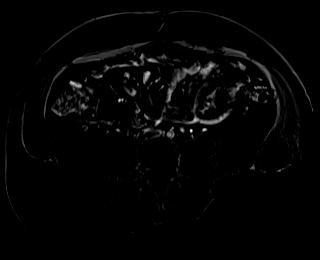
[im 48/96]
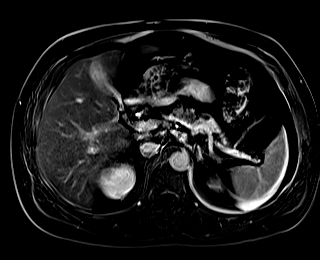
[im 96/96]
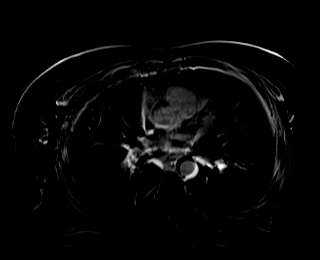

[48 of 48 positions shown; findings below may reference images not displayed]

FINDINGS: Limited study due to motion.

Lower chest: No acute findings.

Hepatobiliary: Liver is enlarged measuring 20 cm in length, with
evidence of hepatic steatosis. There is a 2.4 x 2.5 cm lobulated
hyperintense T2 signal mass at the dome of the right hepatic lobe
which demonstrates enhancement pattern consistent with hemangioma. A
second similar mass consistent with hemangioma is visualized in the
posterior right hepatic lobe segment 7 measuring 1.1 cm. At least 2
additional subcentimeter early hyperenhancing foci identified in the
right lobe which most likely represent flash hemangiomas.
Gallbladder is contracted with thickened and enhancing walls, as
seen on previous CT. No biliary ductal dilatation.

Pancreas: No mass, inflammatory changes, or other parenchymal
abnormality identified.

Spleen:  Within normal limits in size and appearance.

Adrenals/Urinary Tract: No suspicious masses identified. No evidence
of hydronephrosis.

Stomach/Bowel: Visualized portions within the abdomen are
unremarkable.

Vascular/Lymphatic: No pathologically enlarged lymph nodes
identified. No abdominal aortic aneurysm demonstrated.

Other: Partially visualized umbilical hernia containing fat. No
ascites.

Musculoskeletal: No suspicious bone lesions identified.
IMPRESSION: 1. Hepatomegaly and hepatic steatosis.
2. Multiple hepatic hemangiomas, largest at the dome of the right
hepatic lobe.
3. Contracted gallbladder with diffusely thickened and enhancing
walls, as seen on previous CT, consistent with chronic
cholecystitis. Surgical consultation recommended.
4. Umbilical hernia containing fat.

## 2023-07-05 ENCOUNTER — Other Ambulatory Visit

## 2023-07-07 ENCOUNTER — Other Ambulatory Visit: Payer: BC Managed Care – PPO

## 2023-07-14 ENCOUNTER — Encounter: Payer: BC Managed Care – PPO | Admitting: Family Medicine

## 2023-07-31 ENCOUNTER — Other Ambulatory Visit: Payer: Self-pay | Admitting: Family Medicine

## 2023-07-31 ENCOUNTER — Other Ambulatory Visit: Payer: Self-pay

## 2023-08-01 ENCOUNTER — Other Ambulatory Visit: Payer: Self-pay | Admitting: Family Medicine

## 2023-08-01 ENCOUNTER — Other Ambulatory Visit: Payer: Self-pay

## 2023-12-21 ENCOUNTER — Other Ambulatory Visit: Payer: Self-pay

## 2023-12-21 ENCOUNTER — Emergency Department
Admission: EM | Admit: 2023-12-21 | Discharge: 2023-12-21 | Disposition: A | Discharge status: Home / Self Care | Attending: Emergency Medicine | Admitting: Emergency Medicine

## 2023-12-21 DIAGNOSIS — D72829 Elevated white blood cell count, unspecified: Secondary | ICD-10-CM | POA: Diagnosis not present

## 2023-12-21 DIAGNOSIS — K625 Hemorrhage of anus and rectum: Secondary | ICD-10-CM | POA: Diagnosis not present

## 2023-12-21 DIAGNOSIS — F17210 Nicotine dependence, cigarettes, uncomplicated: Secondary | ICD-10-CM | POA: Insufficient documentation

## 2023-12-21 DIAGNOSIS — K644 Residual hemorrhoidal skin tags: Secondary | ICD-10-CM | POA: Diagnosis not present

## 2023-12-21 LAB — CBC
HCT: 41.5 % (ref 39.0–52.0)
Hemoglobin: 14.3 g/dL (ref 13.0–17.0)
MCH: 29.2 pg (ref 26.0–34.0)
MCHC: 34.5 g/dL (ref 30.0–36.0)
MCV: 84.7 fL (ref 80.0–100.0)
Platelets: 296 K/uL (ref 150–400)
RBC: 4.9 MIL/uL (ref 4.22–5.81)
RDW: 14.5 % (ref 11.5–15.5)
WBC: 14.9 K/uL — ABNORMAL HIGH (ref 4.0–10.5)
nRBC: 0 % (ref 0.0–0.2)

## 2023-12-21 LAB — BASIC METABOLIC PANEL WITH GFR
Anion gap: 13 (ref 5–15)
BUN: 14 mg/dL (ref 6–20)
CO2: 21 mmol/L — ABNORMAL LOW (ref 22–32)
Calcium: 9.3 mg/dL (ref 8.9–10.3)
Chloride: 107 mmol/L (ref 98–111)
Creatinine, Ser: 0.8 mg/dL (ref 0.61–1.24)
GFR, Estimated: >60 mL/min (ref >60)
Glucose, Bld: 125 mg/dL — ABNORMAL HIGH (ref 70–99)
Potassium: 3.9 mmol/L (ref 3.5–5.1)
Sodium: 141 mmol/L (ref 135–145)

## 2023-12-21 MED ORDER — DOCUSATE SODIUM 100 MG PO CAPS
100.0000 mg | ORAL_CAPSULE | Freq: Two times a day (BID) | ORAL | 2 refills | Status: AC
  Filled 2023-12-21: qty 60, 30d supply, fill #0
  Filled 2024-01-25: qty 60, 30d supply, fill #1
  Filled 2024-02-21: qty 60, 30d supply, fill #2

## 2023-12-21 MED ORDER — IBUPROFEN 600 MG PO TABS
600.0000 mg | ORAL_TABLET | Freq: Once | ORAL | Status: AC
  Administered 2023-12-21: 600 mg via ORAL
  Filled 2023-12-21: qty 1

## 2023-12-21 MED ORDER — DOCUSATE SODIUM 100 MG PO CAPS
100.0000 mg | ORAL_CAPSULE | Freq: Once | ORAL | Status: AC
Start: 2023-12-21 — End: 2023-12-21
  Administered 2023-12-21: 100 mg via ORAL
  Filled 2023-12-21: qty 1

## 2023-12-21 NOTE — ED Provider Notes (Signed)
 [facility] Provider Note    Event Date/Time   First MD Initiated Contact with Patient 12/21/23 0710     (approximate)  History   Chief Complaint: Rectal Bleeding  HPI  Jonathan Choi is a 48 y.o. male with a past medical history of hyperlipidemia, presents to the emergency department for rectal bleeding.  According to the patient he has external hemorrhoids and states on occasion he will have bleeding after having a bowel movement that is minimal and resolves on its own.  This morning he states he was in the shower and noticed a significant amount of blood mixing with water.  Patient states he has continued to have bleeding.  He was able to pack the area with toilet paper which briefly stop the bleeding but anytime the toilet paper is removed, it begins bleeding once again.  Physical Exam   Triage Vital Signs: ED Triage Vitals [12/21/23 0708]  Encounter Vitals Group     BP (!) 136/108     Girls Systolic BP Percentile      Girls Diastolic BP Percentile      Boys Systolic BP Percentile      Boys Diastolic BP Percentile      Pulse Rate 95     Resp 18     Temp 97.6 F (36.4 C)     Temp src      SpO2 94 %     Weight 200 lb (90.7 kg)     Height 5' 9 (1.753 m)     Head Circumference      Peak Flow      Pain Score 3     Pain Loc      Pain Education      Exclude from Growth Chart     Most recent vital signs: Vitals:   12/21/23 0708 12/21/23 0711  BP: (!) 136/108   Pulse: 95   Resp: 18   Temp: 97.6 F (36.4 C)   SpO2: 94% 95%    General: Awake, no distress.  CV:  Good peripheral perfusion.  Regular rate and rhythm  Resp:  Normal effort.  Equal breath sounds bilaterally.  Abd:  No distention.  Soft, nontender.   Other:  Patient with moderate external hemorrhoids.  Hemorrhoid on the right side of the anus seems to be bleeding with a steady venous stream but stops with packing of the gluteal cleft, but restarts anytime packing is  removed.   ED Results / Procedures / Treatments   MEDICATIONS ORDERED IN ED: Medications - No data to display   IMPRESSION / MDM / ASSESSMENT AND PLAN / ED COURSE  I reviewed the triage vital signs and the nursing notes.  Patient's presentation is most consistent with acute presentation with potential threat to life or bodily function.  Patient presents emergency department for rectal bleeding.  The bleeding appears to be coming from right sided hemorrhoids.  Bleeding is a steady venous stream of blood anytime the gluteal cleft is opened but seems to stop once packed with gauze.  However patient states he has been doing this at home and when he removes the packing or cause the bleeding restarts this is similar in the emergency department.  We will discuss with general surgery for more definitive treatment.  We will check lab work and continue to closely monitor.  Patient CBC shows a reassuring H&H, mild leukocytosis.  Chemistry reassuring.  General surgery has seen and evaluated the patient they have applied silver  nitrate to the area and will see the patient in the office.  Will discharge on Colace.  Patient agreeable to plan of care.  No further bleeding.  FINAL CLINICAL IMPRESSION(S) / ED DIAGNOSES   Hemorrhoidal bleeding  Note:  This document was prepared using Dragon voice recognition software and may include unintentional dictation errors.   Dorothyann Drivers, MD 12/21/23 1005

## 2023-12-21 NOTE — Discharge Instructions (Addendum)
 Please begin taking Colace twice daily as prescribed.  Please follow-up with general surgery by calling the number provided to arrange an appointment in approximately 2 weeks for recheck/reevaluation.  Return to the emergency department for any significant bleeding or any other symptom personally concerning to yourself.

## 2023-12-21 NOTE — ED Notes (Signed)
 Assisted provider with rectal exam/procedure

## 2023-12-21 NOTE — ED Triage Notes (Signed)
 Pt to ED For rectal bleeding started at 0430.

## 2023-12-21 NOTE — Consult Note (Addendum)
 [facility]-General Surgery  SURGICAL CONSULTATION NOTE    HISTORY OF PRESENT ILLNESS (HPI):  48 y.o. male with a medical history of hyperlipidemia, presented to [facility] ED with rectal bleeding. Patient has been dealing with external hemorrhoids for years. Mostly asymptomatic, but has noticed some bleeding with wiping. States the bleeding typically resolve on its own. However this morning during shower, patient noticed significant rectal bleeding, possibly injuring a hemorrhoid. Noticed the bleeding would not stop. Patient was able to pack the area which controlled the bleeding some.  In the ED, patient was afebrile and hypertensive with a BP of 136/108, HR of 95, and RR of 18.  Leukocytosis 14.9. Normal hemoglobin 14.3. No electrolyte disturbance.  Surgery is consulted by Dr. Dorothyann in this context for evaluation and management of external hemorrhoid bleeding.   PAST MEDICAL HISTORY (PMH):  Past Medical History:  Diagnosis Date   Abrasion of anterior right lower leg 12/22/2015   Dental crown present    Ganglion cyst of wrist, left 12/2015   High triglycerides    Pre-diabetes      PAST SURGICAL HISTORY (PSH):  Past Surgical History:  Procedure Laterality Date   CHOLECYSTECTOMY, LAPAROSCOPIC  09/2021   KNEE ARTHROSCOPY Right    MASS EXCISION Left 12/29/2015   Procedure: EXCISION MASS, left wrist;  Surgeon: Franky Curia, MD;  Location: [facility];  Service: Orthopedics;  Laterality: Left;   UMBILICAL HERNIA REPAIR N/A 09/30/2021   Procedure: HERNIA REPAIR UMBILICAL ADULT;  Surgeon: Jordis Laneta FALCON, MD;  Location: [facility];  Service: General;  Laterality: N/A;     MEDICATIONS:  Prior to Admission medications   Medication Sig Start Date End Date Taking? Authorizing Provider  Ibuprofen (ADVIL PO) Take 400 mg by mouth as needed (pain).   Yes [provider]  Melatonin 10 MG TABS Take 1 tablet by mouth as needed.   Yes [provider]  Multiple  Vitamin (MULTIVITAMIN) tablet Take 1 tablet by mouth daily.   Yes [provider]  fenofibrate  160 MG tablet Take 1 tablet (160 mg total) by mouth daily. Patient not taking: Reported on 12/21/2023 06/29/23 06/28/24  Tower, Laine LABOR, MD     ALLERGIES:  Allergies  Allergen Reactions   Cleocin [Clindamycin Hcl] Hives     SOCIAL HISTORY:  Social History   Socioeconomic History   Marital status: Married    Spouse name: Melissa   Number of children: 3   Years of education: Not on file   Highest education level: Not on file  Occupational History   Occupation: welder  Tobacco Use   Smoking status: Every Day    Current packs/day: 0.50    Average packs/day: 0.5 packs/day for 15.0 years (7.5 ttl pk-yrs)    Types: Cigarettes   Smokeless tobacco: Former    Types: Chew    Quit date: 1997  Vaping Use   Vaping status: Never Used  Substance and Sexual Activity   Alcohol use: No    Alcohol/week: 0.0 standard drinks of alcohol   Drug use: No   Sexual activity: Not on file  Other Topics Concern   Not on file  Social History Narrative   Married    3 children    Works in Control and instrumentation engineer.   Social Drivers of Corporate investment banker Strain: Not on file  Food Insecurity: Not on file  Transportation Needs: Not on file  Physical Activity: Not on file  Stress: Not on file  Social Connections: Unknown (  07/21/2021)   [facility]   Social Network    Social Network: Not on file  Intimate Partner Violence: Unknown (06/21/2021)   Received from [facility]   HITS    Physically Hurt: Not on file    Insult or Talk Down To: Not on file    Threaten Physical Harm: Not on file    Scream or Curse: Not on file     FAMILY HISTORY:  Family History  Problem Relation Age of Onset   Hypertension Mother    Diabetes Father    Colon polyps Father    Pneumonia Father    Lung disease Father    Stroke Maternal Grandfather    Heart attack Paternal Grandmother     Esophageal cancer Neg Hx    Rectal cancer Neg Hx    Stomach cancer Neg Hx       REVIEW OF SYSTEMS:  Review of Systems  Constitutional:  Negative for chills and fever.  Gastrointestinal:        Rectal bleeding   Neurological:  Negative for dizziness.    VITAL SIGNS:  Temp:  [97.6 F (36.4 C)] 97.6 F (36.4 C) (10/02 0708) Pulse Rate:  [95] 95 (10/02 0708) Resp:  [18] 18 (10/02 0708) BP: (136)/(108) 136/108 (10/02 0708) SpO2:  [94 %-95 %] 95 % (10/02 0711) Weight:  [90.7 kg] 90.7 kg (10/02 0708)     Height: 5' 9 (175.3 cm) Weight: 90.7 kg BMI (Calculated): 29.52   INTAKE/OUTPUT:  No intake/output data recorded.  PHYSICAL EXAM:  Physical Exam Constitutional:      Appearance: Normal appearance.  HENT:     Head: Normocephalic and atraumatic.  Genitourinary:    Comments: Rosina Charleston, Paramedic was my chaperone during rectal exam.  Multiple external hemorrhoids. Right external hemorrhoid actively bleeding. TTP Neurological:     Mental Status: He is alert.      Labs:     Latest Ref Rng & Units 12/21/2023    7:20 AM 05/20/2022    7:40 AM 06/23/2021   12:49 PM  CBC  WBC 4.0 - 10.5 K/uL 14.9  10.5  11.1   Hemoglobin 13.0 - 17.0 g/dL 85.6  85.2  85.1   Hematocrit 39.0 - 52.0 % 41.5  43.2  42.5   Platelets 150 - 400 K/uL 296  317.0  300       Latest Ref Rng & Units 12/21/2023    7:20 AM 05/20/2022    7:40 AM 06/30/2021    8:11 AM  CMP  Glucose 70 - 99 mg/dL 874  91    BUN 6 - 20 mg/dL 14  15    Creatinine 9.38 - 1.24 mg/dL 9.19  9.03    Sodium 864 - 145 mmol/L 141  141    Potassium 3.5 - 5.1 mmol/L 3.9  4.2    Chloride 98 - 111 mmol/L 107  109    CO2 22 - 32 mmol/L 21  24    Calcium 8.9 - 10.3 mg/dL 9.3  9.5    Total Protein 6.0 - 8.3 g/dL  6.9    Total Bilirubin 0.2 - 1.2 mg/dL  0.4    Alkaline Phos 39 - 117 U/L  79    AST 0 - 37 U/L  19  22   ALT 0 - 53 U/L  36  30     Imaging studies:  N/A  Procedure:   Chaperone: Rosina Charleston, Paramedic assisted    Discussed use of silver  nitrate with patient to cauterize the active bleeding. Patient consented for procedure. Patient was in right lateral decubitus position. Under direct visualization, a silver nitrate stick was carefully applied to the right external hemorrhoid for chemical cauterization. Caution was taken to limit application and avoid adjacent normal mucosa. Bleeding stopped after a few seconds of application.  Patient tolerated procedure. No complications were observed during procedure.    Assessment/Plan: 48 y.o. male with external hemorrhoids, complicated by pertinent comorbidities including hyperlipidemia.   - Used silver nitrate to cauterize the right external hemorrhoid and stop the bleeding. Patient tolerated procedure. No complications were observed. After 30 minutes of application, minimal bleeding was noticed. No contraindication from surgical standpoint for discharge.   - Scheduled an outpatient follow-up to further discuss treatment for external hemorrhoids including surgery.     Discussed case and plan with my supervisor physician, Dr. Tye DO.    Thank you for the opportunity to participate in this patient's care.   -- Gilmer Cea PA-C

## 2024-01-10 DIAGNOSIS — K625 Hemorrhage of anus and rectum: Secondary | ICD-10-CM | POA: Diagnosis not present

## 2024-01-25 ENCOUNTER — Other Ambulatory Visit: Payer: Self-pay

## 2024-01-26 ENCOUNTER — Other Ambulatory Visit: Payer: Self-pay

## 2024-02-21 ENCOUNTER — Other Ambulatory Visit: Payer: Self-pay
